# Patient Record
Sex: Female | Born: 1940 | Race: Black or African American | Hispanic: No | Marital: Married | Smoking: Former smoker
Health system: Southern US, Community
[De-identification: ages and names within clinical notes are randomized; demographics above are authoritative.]

## PROBLEM LIST (undated history)

## (undated) DIAGNOSIS — K118 Other diseases of salivary glands: Secondary | ICD-10-CM

## (undated) DIAGNOSIS — R911 Solitary pulmonary nodule: Secondary | ICD-10-CM

## (undated) DIAGNOSIS — E119 Type 2 diabetes mellitus without complications: Secondary | ICD-10-CM

## (undated) DIAGNOSIS — I10 Essential (primary) hypertension: Secondary | ICD-10-CM

## (undated) DIAGNOSIS — E782 Mixed hyperlipidemia: Secondary | ICD-10-CM

## (undated) HISTORY — DX: Solitary pulmonary nodule: R91.1

## (undated) HISTORY — DX: Other diseases of salivary glands: K11.8

## (undated) HISTORY — DX: Essential (primary) hypertension: I10

## (undated) HISTORY — DX: Mixed hyperlipidemia: E78.2

## (undated) HISTORY — PX: ABDOMINAL HYSTERECTOMY: SHX81

## (undated) HISTORY — DX: Type 2 diabetes mellitus without complications: E11.9

---

## 1999-07-09 ENCOUNTER — Encounter: Admission: RE | Admit: 1999-07-09 | Discharge: 1999-07-09 | Payer: Self-pay | Admitting: *Deleted

## 2000-01-15 ENCOUNTER — Other Ambulatory Visit: Admission: RE | Admit: 2000-01-15 | Discharge: 2000-01-15 | Payer: Self-pay | Admitting: *Deleted

## 2001-04-08 ENCOUNTER — Other Ambulatory Visit: Admission: RE | Admit: 2001-04-08 | Discharge: 2001-04-08 | Payer: Self-pay | Admitting: *Deleted

## 2004-07-17 ENCOUNTER — Other Ambulatory Visit: Admission: RE | Admit: 2004-07-17 | Discharge: 2004-07-17 | Payer: Self-pay | Admitting: Internal Medicine

## 2012-02-03 ENCOUNTER — Emergency Department (HOSPITAL_COMMUNITY)
Admission: EM | Admit: 2012-02-03 | Discharge: 2012-02-03 | Disposition: A | Discharge status: Home / Self Care | Payer: Medicare Other | Attending: Emergency Medicine | Admitting: Emergency Medicine

## 2012-02-03 ENCOUNTER — Encounter (HOSPITAL_COMMUNITY): Payer: Self-pay | Admitting: Emergency Medicine

## 2012-02-03 DIAGNOSIS — L299 Pruritus, unspecified: Secondary | ICD-10-CM | POA: Insufficient documentation

## 2012-02-03 DIAGNOSIS — R21 Rash and other nonspecific skin eruption: Secondary | ICD-10-CM

## 2012-02-03 MED ORDER — HYDROCORTISONE 1 % EX CREA: TOPICAL_CREAM | Freq: Three times a day (TID) | CUTANEOUS | Status: AC

## 2012-02-03 MED ORDER — LORATADINE 10 MG PO TABS: 10.0000 mg | ORAL_TABLET | Freq: Every day | ORAL | Status: DC

## 2012-02-03 MED ORDER — HYDROCORTISONE 1 % EX CREA
TOPICAL_CREAM | Freq: Three times a day (TID) | CUTANEOUS | Status: DC
Start: 2012-02-03 — End: 2012-02-03
  Administered 2012-02-03: 04:00:00 via TOPICAL
  Filled 2012-02-03: qty 28

## 2012-02-03 MED ORDER — FAMOTIDINE 20 MG PO TABS
20.0000 mg | ORAL_TABLET | Freq: Once | ORAL | Status: AC
  Administered 2012-02-03: 20 mg via ORAL
  Filled 2012-02-03: qty 1

## 2012-02-03 MED ORDER — HYDROXYZINE HCL 25 MG PO TABS
50.0000 mg | ORAL_TABLET | Freq: Once | ORAL | Status: AC
  Administered 2012-02-03: 50 mg via ORAL
  Filled 2012-02-03 (×2): qty 1

## 2012-02-03 MED ORDER — HYDROXYZINE HCL 25 MG PO TABS: 25.0000 mg | ORAL_TABLET | Freq: Four times a day (QID) | ORAL | Status: AC | PRN

## 2012-02-03 MED ORDER — FAMOTIDINE 20 MG PO TABS: 20.0000 mg | ORAL_TABLET | Freq: Two times a day (BID) | ORAL | Status: DC

## 2012-02-03 NOTE — ED Notes (Signed)
PT. REPORTS PERSISTENT ITCHY RASHES AT UPPER/INNER THIGHS FOR SEVERAL DAYS , SEEN BY HER PCP DIAGNOSED WITH YEAST INFECTION - PRESCRIBED WITH KETOCONAZOLE CREAM WITH NO IMPROVEMENT.

## 2012-02-03 NOTE — ED Notes (Signed)
Persistent itch upper inner thighs. Pt saw MD 2 days ago and was given Benadryl PO and a Benadryl cream. Pt states that MD said it was from patient diabetes.

## 2012-02-08 NOTE — ED Provider Notes (Signed)
History     CSN: [id number]  Arrival date & time 02/03/12  0228   First MD Initiated Contact with Patient 02/03/12 [phone]      Chief Complaint  Patient presents with  . Pruritis    (Consider location/radiation/quality/duration/timing/severity/associated sxs/prior treatment) HPI 71 yo female presents to the ER with complaint of itching.  Pt reports onset of rash to inner thighs initially, now on upper back, arms.  Pt was seen by her pcm, told she had fungal infection and started on ketoconazole.  Pt has been taking benadryl as well without relief.  No new soaps/detergents.  No prior h/o similar rashes. No insect exposure, no poison ivy/oak.    Past Medical History  Diagnosis Date  . Hypertension   . Diabetes mellitus     History reviewed. No pertinent past surgical history.  No family history on file.  History  Substance Use Topics  . Smoking status: Current Everyday Smoker  . Smokeless tobacco: Not on file  . Alcohol Use: No    OB History    Grav Para Term Preterm Abortions TAB SAB Ect Mult Living                  Review of Systems  All other systems reviewed and are negative.    Allergies  Review of patient's allergies indicates no known allergies.  Home Medications   Current Outpatient Rx  Name Route Sig Dispense Refill  . ATORVASTATIN CALCIUM 40 MG PO TABS Oral Take 20 mg by mouth every other day.    Marland Kitchen KETOCONAZOLE 2 % EX CREA Topical Apply 1 application topically 2 (two) times daily. Apply to affected areas    . LISINOPRIL 40 MG PO TABS Oral Take 40 mg by mouth daily.    Marland Kitchen METFORMIN HCL 500 MG PO TABS Oral Take 500 mg by mouth 2 (two) times daily with a meal.    . TRIAMTERENE-HCTZ 37.5-25 MG PO CAPS Oral Take 1 capsule by mouth every morning.    Marland Kitchen FAMOTIDINE 20 MG PO TABS Oral Take 1 tablet (20 mg total) by mouth 2 (two) times daily. 30 tablet 0  . HYDROCORTISONE 1 % EX CREA Topical Apply topically 3 (three) times daily. 30 g 0  . HYDROXYZINE HCL 25 MG  PO TABS Oral Take 1 tablet (25 mg total) by mouth every 6 (six) hours as needed for itching. 12 tablet 0  . LORATADINE 10 MG PO TABS Oral Take 1 tablet (10 mg total) by mouth daily. 15 tablet 0    BP 105/59  Pulse 57  Temp 97.9 F (36.6 C) (Oral)  Resp 18  SpO2 96%  Physical Exam  Nursing note and vitals reviewed. Skin:       Rash noted to thighs, excoriated with macular appearance.  Rash on back slightly raised, linear in almost christmas tree pattern, no cigarette paper appearance or herald patch noted    ED Course  Procedures (including critical care time)  Labs Reviewed - No data to display No results found.   1. Rash and nonspecific skin eruption       MDM  Pt with pruritic rash.  She is feeling better after hydrocortisone cream and hydroxyzine.  Possible pityriasis like rash on back, but does not follow classic presentation.  Will continue symptomatic treatment and f/u with pcm.        Olivia Mackie, MD 02/08/12 2040

## 2013-04-11 ENCOUNTER — Encounter (HOSPITAL_COMMUNITY): Payer: Self-pay | Admitting: Nurse Practitioner

## 2013-04-11 ENCOUNTER — Emergency Department (HOSPITAL_COMMUNITY)
Admission: EM | Admit: 2013-04-11 | Discharge: 2013-04-11 | Discharge status: Against Medical Advice | Payer: Medicare Other | Source: Home / Self Care

## 2013-04-11 ENCOUNTER — Emergency Department (HOSPITAL_BASED_OUTPATIENT_CLINIC_OR_DEPARTMENT_OTHER)
Admission: EM | Admit: 2013-04-11 | Discharge: 2013-04-11 | Disposition: A | Discharge status: Home / Self Care | Payer: Medicare Other | Attending: Emergency Medicine | Admitting: Emergency Medicine

## 2013-04-11 ENCOUNTER — Encounter (HOSPITAL_BASED_OUTPATIENT_CLINIC_OR_DEPARTMENT_OTHER): Payer: Self-pay | Admitting: *Deleted

## 2013-04-11 ENCOUNTER — Emergency Department (HOSPITAL_BASED_OUTPATIENT_CLINIC_OR_DEPARTMENT_OTHER): Payer: Medicare Other

## 2013-04-11 DIAGNOSIS — F17200 Nicotine dependence, unspecified, uncomplicated: Secondary | ICD-10-CM | POA: Insufficient documentation

## 2013-04-11 DIAGNOSIS — I10 Essential (primary) hypertension: Secondary | ICD-10-CM | POA: Insufficient documentation

## 2013-04-11 DIAGNOSIS — M549 Dorsalgia, unspecified: Secondary | ICD-10-CM | POA: Insufficient documentation

## 2013-04-11 DIAGNOSIS — Z79899 Other long term (current) drug therapy: Secondary | ICD-10-CM | POA: Insufficient documentation

## 2013-04-11 DIAGNOSIS — E119 Type 2 diabetes mellitus without complications: Secondary | ICD-10-CM | POA: Insufficient documentation

## 2013-04-11 DIAGNOSIS — Z7982 Long term (current) use of aspirin: Secondary | ICD-10-CM | POA: Insufficient documentation

## 2013-04-11 DIAGNOSIS — R112 Nausea with vomiting, unspecified: Secondary | ICD-10-CM

## 2013-04-11 DIAGNOSIS — R1110 Vomiting, unspecified: Secondary | ICD-10-CM | POA: Insufficient documentation

## 2013-04-11 DIAGNOSIS — Z90710 Acquired absence of both cervix and uterus: Secondary | ICD-10-CM | POA: Insufficient documentation

## 2013-04-11 LAB — CBC WITH DIFFERENTIAL/PLATELET
Eosinophils Relative: 0 % (ref 0–5)
Lymphocytes Relative: 24 % (ref 12–46)
Lymphs Abs: 1.4 10*3/uL (ref 0.7–4.0)
MCV: 76.3 fL — ABNORMAL LOW (ref 78.0–100.0)
Neutro Abs: 3.8 10*3/uL (ref 1.7–7.7)
Platelets: 196 10*3/uL (ref 150–400)
RBC: 6.58 MIL/uL — ABNORMAL HIGH (ref 3.87–5.11)
WBC: 5.6 10*3/uL (ref 4.0–10.5)

## 2013-04-11 LAB — URINALYSIS, ROUTINE W REFLEX MICROSCOPIC
Bilirubin Urine: NEGATIVE
Ketones, ur: 15 mg/dL — AB
Protein, ur: NEGATIVE mg/dL
Urobilinogen, UA: 0.2 mg/dL (ref 0.0–1.0)
pH: 7 (ref 5.0–8.0)

## 2013-04-11 LAB — COMPREHENSIVE METABOLIC PANEL
ALT: 11 U/L (ref 0–35)
AST: 15 U/L (ref 0–37)
Alkaline Phosphatase: 84 U/L (ref 39–117)
CO2: 26 mEq/L (ref 19–32)
Calcium: 10.4 mg/dL (ref 8.4–10.5)
Chloride: 97 mEq/L (ref 96–112)
GFR calc Af Amer: 63 mL/min — ABNORMAL LOW (ref >90)
GFR calc non Af Amer: 55 mL/min — ABNORMAL LOW (ref >90)
Glucose, Bld: 135 mg/dL — ABNORMAL HIGH (ref 70–99)
Potassium: 4.2 mEq/L (ref 3.5–5.1)
Sodium: 136 mEq/L (ref 135–145)
Total Bilirubin: 0.7 mg/dL (ref 0.3–1.2)

## 2013-04-11 MED ORDER — IOHEXOL 300 MG/ML  SOLN
100.0000 mL | Freq: Once | INTRAMUSCULAR | Status: AC | PRN
  Administered 2013-04-11: 100 mL via INTRAVENOUS

## 2013-04-11 MED ORDER — ONDANSETRON 4 MG PO TBDP
8.0000 mg | ORAL_TABLET | Freq: Once | ORAL | Status: AC
  Administered 2013-04-11: 8 mg via ORAL
  Filled 2013-04-11: qty 2

## 2013-04-11 MED ORDER — IOHEXOL 300 MG/ML  SOLN
50.0000 mL | Freq: Once | INTRAMUSCULAR | Status: AC | PRN
  Administered 2013-04-11: 50 mL via ORAL

## 2013-04-11 MED ORDER — ONDANSETRON HCL 4 MG PO TABS: 4.0000 mg | ORAL_TABLET | Freq: Three times a day (TID) | ORAL | Status: DC | PRN

## 2013-04-11 NOTE — ED Notes (Signed)
States since she ate chicken on Sunday she has been nauseated with abd and back pain. Reports normal BM this am.

## 2013-04-11 NOTE — ED Notes (Signed)
Pt. Actively vomiting in the waiting room.  Pt. Medicated per protocol with Zofran 8mg  ODT cool wash cloth given.

## 2013-04-11 NOTE — ED Notes (Signed)
Pt c/o abd pain with nausea only x 3 days

## 2013-04-11 NOTE — ED Notes (Signed)
Encouraged pt. To stay.  Pt. s son informed RN that they were taking pt. To [facility].

## 2013-04-11 NOTE — ED Provider Notes (Signed)
CSN: [id number]     Arrival date & time 04/11/13  1842 History  This chart was scribed for Melanie B. Bernette Mayers, MD by Dorothey Baseman, ED Scribe. This patient was seen in room MH04/MH04 and the patient's care was started at 8:34 PM.    Chief Complaint  Patient presents with  . Abdominal Pain   The history is provided by the patient. No language interpreter was used.   HPI Comments: Melanie Weaver is a 72 y.o. female who presents to the Emergency Department complaining of abdominal pain, described as a cramping that is exacerbated with movement, with associated nausea and back pain onset 3 days ago with associated emesis onset last night (last episode a few hours ago). She reports taking milk of magnesia 3 days ago without relief. Patient reports a normal BM this morning. She denies any abdominal bloating and hematemesis. She denies any sick contacts. Patient reports a history of hysterectomy.   Past Medical History  Diagnosis Date  . Hypertension   . Diabetes mellitus    Past Surgical History  Procedure Laterality Date  . Abdominal hysterectomy     History reviewed. No pertinent family history. History  Substance Use Topics  . Smoking status: Current Every Day Smoker    Types: Cigarettes  . Smokeless tobacco: Not on file  . Alcohol Use: No   OB History   Grav Para Term Preterm Abortions TAB SAB Ect Mult Living                 Review of Systems  A complete 10 system review of systems was obtained and all systems are negative except as noted in the HPI and PMH.   Allergies  Review of patient's allergies indicates no known allergies.  Home Medications   Current Outpatient Rx  Name  Route  Sig  Dispense  Refill  . aspirin EC 81 MG tablet   Oral   Take 81 mg by mouth daily.         Marland Kitchen lisinopril (PRINIVIL,ZESTRIL) 40 MG tablet   Oral   Take 40 mg by mouth daily.         . metFORMIN (GLUCOPHAGE) 500 MG tablet   Oral   Take 500 mg by mouth 2 (two) times daily with a  meal.         . Multiple Vitamin (MULTIVITAMIN WITH MINERALS) TABS tablet   Oral   Take 1 tablet by mouth daily.         Marland Kitchen triamterene-hydrochlorothiazide (MAXZIDE-25) 37.5-25 MG per tablet   Oral   Take 1 tablet by mouth daily.          Triage Vitals: BP 157/58  Pulse 65  Temp(Src) 99.4 F (37.4 C) (Oral)  Resp 16  Ht 5\' 2"  (1.575 m)  Wt 165 lb (74.844 kg)  BMI 30.17 kg/m2  SpO2 98%  Physical Exam  Constitutional: She is oriented to person, place, and time. She appears well-developed and well-nourished.  HENT:  Head: Normocephalic and atraumatic.  Mouth/Throat: Oropharynx is clear and moist.  Mouth appears a little dry.  Neck: Neck supple.  Pulmonary/Chest: Effort normal.  Abdominal: Soft. Bowel sounds are normal. She exhibits no distension. There is no tenderness. There is no rebound and no guarding.  Neurological: She is alert and oriented to person, place, and time. No cranial nerve deficit.  Psychiatric: She has a normal mood and affect. Her behavior is normal.    ED Course  Procedures (including critical care time)  Medications  iohexol (OMNIPAQUE) 300 MG/ML solution 50 mL (50 mLs Oral Contrast Given 04/11/13 2053)  iohexol (OMNIPAQUE) 300 MG/ML solution 100 mL (100 mLs Intravenous Contrast Given 04/11/13 2154)    DIAGNOSTIC STUDIES: Oxygen Saturation is 98% on room air, normal by my interpretation.    COORDINATION OF CARE: 8:36PM- Will order UA and an abdominal CT scan. Discussed treatment plan with patient at bedside and patient verbalized agreement.     Labs Review Labs Reviewed  URINALYSIS, ROUTINE W REFLEX MICROSCOPIC - Abnormal; Notable for the following:    APPearance TURBID (*)    Ketones, ur 15 (*)    Leukocytes, UA SMALL (*)    All other components within normal limits  CBC WITH DIFFERENTIAL - Abnormal; Notable for the following:    RBC 6.58 (*)    Hemoglobin 17.0 (*)    HCT 50.2 (*)    MCV 76.3 (*)    MCH 25.8 (*)    RDW 16.5 (*)     All other components within normal limits  COMPREHENSIVE METABOLIC PANEL - Abnormal; Notable for the following:    Glucose, Bld 135 (*)    GFR calc non Af Amer 55 (*)    GFR calc Af Amer 63 (*)    All other components within normal limits  URINE MICROSCOPIC-ADD ON - Abnormal; Notable for the following:    Squamous Epithelial / LPF FEW (*)    Bacteria, UA FEW (*)    All other components within normal limits  URINE CULTURE  LIPASE, BLOOD  CG4 I-STAT (LACTIC ACID)   Imaging Review Ct Abdomen Pelvis W Contrast  04/11/2013   CLINICAL DATA:  Left abdominal pain  EXAM: CT ABDOMEN AND PELVIS WITH CONTRAST  TECHNIQUE: Multidetector CT imaging of the abdomen and pelvis was performed using the standard protocol following bolus administration of intravenous contrast.  CONTRAST:  50mL OMNIPAQUE IOHEXOL 300 MG/ML SOLN, OMNIPAQUE IOHEXOL 300 MG/ML SOLN  COMPARISON:  None.  FINDINGS: Lower Chest: The lung bases are clear. Visualized cardiac structures are within normal limits for size. No pericardial effusion. Unremarkable visualized distal thoracic esophagus.  Abdomen: Unremarkable CT appearance of the stomach, duodenum, spleen, adrenal glands,. Mild prominence of the main pancreatic duct to 4 mm. A tiny 4 mm hypoattenuating focus in the posterior aspect of the pancreatic body is nonspecific.  Normal hepatic morphology and contours. Geographic hypoattenuation in the left hemi-liver adjacent to the fissure for the falciform ligament is nonspecific but most suggestive of benign focal fatty infiltration. Nonspecific subcentimeter hypo attenuating focus in hepatic segment 2 is too small for accurate characterization. Statistically, this is likely a benign cyst. High attenuation stones layer within the gallbladder lumen. No gallbladder wall thickening or pericholecystic fluid.  No hydronephrosis or enhancing renal mass. Punctate 2- 3 mm nonobstructing stones bilaterally.  No evidence of obstruction or focal bowel  wall thickening. The appendix is not identified. However, there is no focal inflammatory change in the right lower quadrant to suggest appendicitis. The terminal ileum is unremarkable.  Pelvis: Unremarkable bladder. Surgical changes of prior hysterectomy. Bilateral adnexa are unremarkable. No free fluid or suspicious adenopathy.  Bones/Soft Tissues: No acute fracture or aggressive appearing lytic or blastic osseous lesion. Multilevel degenerative disc disease most significant at L4-L5. Lower lumbar facet arthropathy. Nonspecific well-marginated sclerotic focus in the posterior right acetabulum likely represents a bone island.  Vascular: Extensive atherosclerotic vascular calcifications without aneurysmal dilatation.  IMPRESSION: 1. No acute abnormality in the abdomen or pelvis. 2. Cholelithiasis without  secondary signs to suggest acute cholecystitis. 3. Nonspecific small 4 mm hypo attenuating focus in the posterior aspect of the pancreatic body. Differential considerations include small pancreatic cyst, branch duct IPMN and early pancreatic neoplasm. Consider further evaluation with pancreas protocol MRI in 1 year 's time. 4. Extensive atherosclerotic vascular calcifications without aneurysmal dilatation. 5. Nonobstructing renal calculi bilaterally.   Electronically Signed   By: Malachy Moan M.D.   On: 04/11/2013 22:21    MDM   1. Nausea & vomiting     Labs and imaging reviewed. Pt and family informed of incidental CT findings. Pt feeling better, ready to go home. No vomiting in the ED.    I personally performed the services described in this documentation, which was scribed in my presence. The recorded information has been reviewed and is accurate.       Melanie B. Bernette Mayers, MD 04/11/13 2255

## 2013-04-13 LAB — URINE CULTURE
Colony Count: NO GROWTH
Culture: NO GROWTH

## 2013-05-04 ENCOUNTER — Encounter (HOSPITAL_COMMUNITY): Payer: Self-pay | Admitting: Emergency Medicine

## 2013-05-04 ENCOUNTER — Emergency Department (HOSPITAL_COMMUNITY)
Admission: EM | Admit: 2013-05-04 | Discharge: 2013-05-04 | Disposition: A | Discharge status: Home / Self Care | Payer: Medicare Other | Attending: Emergency Medicine | Admitting: Emergency Medicine

## 2013-05-04 ENCOUNTER — Emergency Department (HOSPITAL_COMMUNITY): Payer: Medicare Other

## 2013-05-04 DIAGNOSIS — R1084 Generalized abdominal pain: Secondary | ICD-10-CM | POA: Insufficient documentation

## 2013-05-04 DIAGNOSIS — Z7982 Long term (current) use of aspirin: Secondary | ICD-10-CM | POA: Insufficient documentation

## 2013-05-04 DIAGNOSIS — K5900 Constipation, unspecified: Secondary | ICD-10-CM

## 2013-05-04 DIAGNOSIS — I10 Essential (primary) hypertension: Secondary | ICD-10-CM | POA: Insufficient documentation

## 2013-05-04 DIAGNOSIS — F17200 Nicotine dependence, unspecified, uncomplicated: Secondary | ICD-10-CM | POA: Insufficient documentation

## 2013-05-04 DIAGNOSIS — R109 Unspecified abdominal pain: Secondary | ICD-10-CM

## 2013-05-04 DIAGNOSIS — E119 Type 2 diabetes mellitus without complications: Secondary | ICD-10-CM | POA: Insufficient documentation

## 2013-05-04 DIAGNOSIS — Z79899 Other long term (current) drug therapy: Secondary | ICD-10-CM | POA: Insufficient documentation

## 2013-05-04 DIAGNOSIS — R1110 Vomiting, unspecified: Secondary | ICD-10-CM | POA: Insufficient documentation

## 2013-05-04 LAB — COMPREHENSIVE METABOLIC PANEL
ALT: 10 U/L (ref 0–35)
Albumin: 4 g/dL (ref 3.5–5.2)
Alkaline Phosphatase: 77 U/L (ref 39–117)
BUN: 22 mg/dL (ref 6–23)
Calcium: 10.4 mg/dL (ref 8.4–10.5)
GFR calc Af Amer: 72 mL/min — ABNORMAL LOW (ref >90)
Glucose, Bld: 110 mg/dL — ABNORMAL HIGH (ref 70–99)
Potassium: 3.9 mEq/L (ref 3.5–5.1)
Sodium: 136 mEq/L (ref 135–145)
Total Protein: 7.3 g/dL (ref 6.0–8.3)

## 2013-05-04 LAB — URINALYSIS, ROUTINE W REFLEX MICROSCOPIC
Bilirubin Urine: NEGATIVE
Leukocytes, UA: NEGATIVE
Nitrite: NEGATIVE
Specific Gravity, Urine: 1.021 (ref 1.005–1.030)
Urobilinogen, UA: 1 mg/dL (ref 0.0–1.0)
pH: 7 (ref 5.0–8.0)

## 2013-05-04 LAB — CBC WITH DIFFERENTIAL/PLATELET
Basophils Relative: 1 % (ref 0–1)
Eosinophils Absolute: 0 10*3/uL (ref 0.0–0.7)
Eosinophils Relative: 1 % (ref 0–5)
Lymphs Abs: 1.5 10*3/uL (ref 0.7–4.0)
MCH: 26.4 pg (ref 26.0–34.0)
MCHC: 34.3 g/dL (ref 30.0–36.0)
MCV: 76.9 fL — ABNORMAL LOW (ref 78.0–100.0)
Neutrophils Relative %: 62 % (ref 43–77)
Platelets: 186 10*3/uL (ref 150–400)

## 2013-05-04 LAB — LIPASE, BLOOD: Lipase: 35 U/L (ref 11–59)

## 2013-05-04 MED ORDER — ONDANSETRON HCL 4 MG/2ML IJ SOLN
4.0000 mg | Freq: Once | INTRAMUSCULAR | Status: AC
  Administered 2013-05-04: 4 mg via INTRAVENOUS
  Filled 2013-05-04: qty 2

## 2013-05-04 MED ORDER — SODIUM CHLORIDE 0.9 % IV SOLN
INTRAVENOUS | Status: DC
  Administered 2013-05-04: 13:00:00 via INTRAVENOUS

## 2013-05-04 MED ORDER — SUCRALFATE 1 G PO TABS: 1.0000 g | ORAL_TABLET | Freq: Four times a day (QID) | ORAL | Status: DC

## 2013-05-04 NOTE — ED Provider Notes (Signed)
CSN: [id number]     Arrival date & time 05/04/13  1135 History   First MD Initiated Contact with Patient 05/04/13 1157     Chief Complaint  Patient presents with  . Abdominal Pain   (Consider location/radiation/quality/duration/timing/severity/associated sxs/prior Treatment) Patient is a 72 y.o. female presenting with abdominal pain. The history is provided by the patient.  Abdominal Pain  patient complain of having abdominal pain that began this morning after she drank liquids. Has had emesis x2 which was nonbilious without fever or diarrhea. Seen at [facility] recently for similar symptoms and was diagnosed with possible kidney stone. She denies any hematuria or dysuria now. No chest pain or chest pressure. No dyspnea noted. No syncope or near syncope. Pain is characterized as colicky and no treatment used for this prior to arrival  Past Medical History  Diagnosis Date  . Hypertension   . Diabetes mellitus    Past Surgical History  Procedure Laterality Date  . Abdominal hysterectomy     No family history on file. History  Substance Use Topics  . Smoking status: Current Every Day Smoker    Types: Cigarettes  . Smokeless tobacco: Not on file  . Alcohol Use: No   OB History   Grav Para Term Preterm Abortions TAB SAB Ect Mult Living                 Review of Systems  Gastrointestinal: Positive for abdominal pain.  All other systems reviewed and are negative.    Allergies  Review of patient's allergies indicates no known allergies.  Home Medications   Current Outpatient Rx  Name  Route  Sig  Dispense  Refill  . aspirin EC 81 MG tablet   Oral   Take 81 mg by mouth daily.         Marland Kitchen lisinopril (PRINIVIL,ZESTRIL) 40 MG tablet   Oral   Take 40 mg by mouth daily.         . metFORMIN (GLUCOPHAGE) 500 MG tablet   Oral   Take 500 mg by mouth 2 (two) times daily with a meal.         . Multiple Vitamin (MULTIVITAMIN WITH MINERALS) TABS tablet   Oral  Take 1 tablet by mouth daily.         Marland Kitchen triamterene-hydrochlorothiazide (MAXZIDE-25) 37.5-25 MG per tablet   Oral   Take 1 tablet by mouth daily.          BP 155/69  Pulse 63  Temp(Src) 99 F (37.2 C) (Oral)  Resp 20  SpO2 98% Physical Exam  Nursing note and vitals reviewed. Constitutional: She is oriented to person, place, and time. She appears well-developed and well-nourished.  Non-toxic appearance. No distress.  HENT:  Head: Normocephalic and atraumatic.  Eyes: Conjunctivae, EOM and lids are normal. Pupils are equal, round, and reactive to light.  Neck: Normal range of motion. Neck supple. No tracheal deviation present. No mass present.  Cardiovascular: Normal rate, regular rhythm and normal heart sounds.  Exam reveals no gallop.   No murmur heard. Pulmonary/Chest: Effort normal and breath sounds normal. No stridor. No respiratory distress. She has no decreased breath sounds. She has no wheezes. She has no rhonchi. She has no rales.  Abdominal: Soft. Normal appearance and bowel sounds are normal. She exhibits no distension. There is generalized tenderness. There is no rigidity, no rebound and no CVA tenderness.  Musculoskeletal: Normal range of motion. She exhibits no edema and no tenderness.  Neurological: She is alert and oriented to person, place, and time. She has normal strength. No cranial nerve deficit or sensory deficit. GCS eye subscore is 4. GCS verbal subscore is 5. GCS motor subscore is 6.  Skin: Skin is warm and dry. No abrasion and no rash noted.  Psychiatric: She has a normal mood and affect. Her speech is normal and behavior is normal.    ED Course  Procedures (including critical care time) Labs Review Labs Reviewed  CBC WITH DIFFERENTIAL  COMPREHENSIVE METABOLIC PANEL  LIPASE, BLOOD  URINALYSIS, ROUTINE W REFLEX MICROSCOPIC   Imaging Review No results found.  EKG Interpretation   None       MDM  No diagnosis found.   Pt given iv fluids and  feels better, no surgical abd--suspect constipation--stable for d/c  Toy Baker, MD 05/04/13 1531

## 2013-05-04 NOTE — ED Notes (Signed)
Pt aware we need urine sample, sts she didn't have anything to drink all day and will need a little bit more time.

## 2013-05-04 NOTE — ED Notes (Signed)
Pt states the other week [facility] for abdominal [facility] and nausea, was told had kidney stone, pt states went away for about a week then last night started having abdominal pains again, states feels like she's having contractions.

## 2015-08-30 DIAGNOSIS — E782 Mixed hyperlipidemia: Secondary | ICD-10-CM | POA: Diagnosis not present

## 2015-08-30 DIAGNOSIS — I10 Essential (primary) hypertension: Secondary | ICD-10-CM | POA: Diagnosis not present

## 2015-08-30 DIAGNOSIS — E119 Type 2 diabetes mellitus without complications: Secondary | ICD-10-CM | POA: Diagnosis not present

## 2015-08-30 DIAGNOSIS — Z0000 Encounter for general adult medical examination without abnormal findings: Secondary | ICD-10-CM | POA: Diagnosis not present

## 2015-09-05 DIAGNOSIS — I10 Essential (primary) hypertension: Secondary | ICD-10-CM | POA: Diagnosis not present

## 2015-09-05 DIAGNOSIS — E782 Mixed hyperlipidemia: Secondary | ICD-10-CM | POA: Diagnosis not present

## 2015-09-05 DIAGNOSIS — E119 Type 2 diabetes mellitus without complications: Secondary | ICD-10-CM | POA: Diagnosis not present

## 2015-11-14 DIAGNOSIS — E119 Type 2 diabetes mellitus without complications: Secondary | ICD-10-CM | POA: Diagnosis not present

## 2015-11-14 DIAGNOSIS — H2513 Age-related nuclear cataract, bilateral: Secondary | ICD-10-CM | POA: Diagnosis not present

## 2016-05-07 DIAGNOSIS — E782 Mixed hyperlipidemia: Secondary | ICD-10-CM | POA: Diagnosis not present

## 2016-05-07 DIAGNOSIS — E119 Type 2 diabetes mellitus without complications: Secondary | ICD-10-CM | POA: Diagnosis not present

## 2016-05-07 DIAGNOSIS — I10 Essential (primary) hypertension: Secondary | ICD-10-CM | POA: Diagnosis not present

## 2016-05-14 DIAGNOSIS — E782 Mixed hyperlipidemia: Secondary | ICD-10-CM | POA: Diagnosis not present

## 2016-05-14 DIAGNOSIS — E119 Type 2 diabetes mellitus without complications: Secondary | ICD-10-CM | POA: Diagnosis not present

## 2016-05-14 DIAGNOSIS — I10 Essential (primary) hypertension: Secondary | ICD-10-CM | POA: Diagnosis not present

## 2016-07-15 DIAGNOSIS — I10 Essential (primary) hypertension: Secondary | ICD-10-CM | POA: Diagnosis not present

## 2016-07-15 DIAGNOSIS — E782 Mixed hyperlipidemia: Secondary | ICD-10-CM | POA: Diagnosis not present

## 2016-07-15 DIAGNOSIS — E119 Type 2 diabetes mellitus without complications: Secondary | ICD-10-CM | POA: Diagnosis not present

## 2016-10-07 DIAGNOSIS — E119 Type 2 diabetes mellitus without complications: Secondary | ICD-10-CM | POA: Diagnosis not present

## 2016-10-07 DIAGNOSIS — Z780 Asymptomatic menopausal state: Secondary | ICD-10-CM | POA: Diagnosis not present

## 2016-10-07 DIAGNOSIS — E782 Mixed hyperlipidemia: Secondary | ICD-10-CM | POA: Diagnosis not present

## 2016-10-07 DIAGNOSIS — Z0000 Encounter for general adult medical examination without abnormal findings: Secondary | ICD-10-CM | POA: Diagnosis not present

## 2016-10-14 DIAGNOSIS — I10 Essential (primary) hypertension: Secondary | ICD-10-CM | POA: Diagnosis not present

## 2016-10-14 DIAGNOSIS — E782 Mixed hyperlipidemia: Secondary | ICD-10-CM | POA: Diagnosis not present

## 2016-10-14 DIAGNOSIS — Z780 Asymptomatic menopausal state: Secondary | ICD-10-CM | POA: Diagnosis not present

## 2016-10-14 DIAGNOSIS — E119 Type 2 diabetes mellitus without complications: Secondary | ICD-10-CM | POA: Diagnosis not present

## 2016-11-21 DIAGNOSIS — R252 Cramp and spasm: Secondary | ICD-10-CM | POA: Diagnosis not present

## 2016-11-21 DIAGNOSIS — R899 Unspecified abnormal finding in specimens from other organs, systems and tissues: Secondary | ICD-10-CM | POA: Diagnosis not present

## 2016-11-21 DIAGNOSIS — R918 Other nonspecific abnormal finding of lung field: Secondary | ICD-10-CM | POA: Diagnosis not present

## 2016-11-21 DIAGNOSIS — R05 Cough: Secondary | ICD-10-CM | POA: Diagnosis not present

## 2016-11-21 DIAGNOSIS — J181 Lobar pneumonia, unspecified organism: Secondary | ICD-10-CM | POA: Diagnosis not present

## 2016-11-21 DIAGNOSIS — R509 Fever, unspecified: Secondary | ICD-10-CM | POA: Diagnosis not present

## 2016-11-21 DIAGNOSIS — I771 Stricture of artery: Secondary | ICD-10-CM | POA: Diagnosis not present

## 2016-11-21 DIAGNOSIS — R5383 Other fatigue: Secondary | ICD-10-CM | POA: Diagnosis not present

## 2016-11-22 DIAGNOSIS — R252 Cramp and spasm: Secondary | ICD-10-CM | POA: Diagnosis not present

## 2016-11-22 DIAGNOSIS — R5383 Other fatigue: Secondary | ICD-10-CM | POA: Diagnosis not present

## 2016-11-28 DIAGNOSIS — J189 Pneumonia, unspecified organism: Secondary | ICD-10-CM | POA: Diagnosis not present

## 2016-11-28 DIAGNOSIS — R899 Unspecified abnormal finding in specimens from other organs, systems and tissues: Secondary | ICD-10-CM | POA: Diagnosis not present

## 2016-11-28 DIAGNOSIS — Z7689 Persons encountering health services in other specified circumstances: Secondary | ICD-10-CM | POA: Diagnosis not present

## 2016-11-28 DIAGNOSIS — M47899 Other spondylosis, site unspecified: Secondary | ICD-10-CM | POA: Diagnosis not present

## 2016-11-28 DIAGNOSIS — J181 Lobar pneumonia, unspecified organism: Secondary | ICD-10-CM | POA: Diagnosis not present

## 2017-01-05 DIAGNOSIS — E785 Hyperlipidemia, unspecified: Secondary | ICD-10-CM | POA: Insufficient documentation

## 2017-01-05 DIAGNOSIS — I10 Essential (primary) hypertension: Secondary | ICD-10-CM | POA: Diagnosis not present

## 2017-01-05 DIAGNOSIS — E119 Type 2 diabetes mellitus without complications: Secondary | ICD-10-CM | POA: Diagnosis not present

## 2017-04-06 DIAGNOSIS — E119 Type 2 diabetes mellitus without complications: Secondary | ICD-10-CM | POA: Diagnosis not present

## 2017-04-06 DIAGNOSIS — Z720 Tobacco use: Secondary | ICD-10-CM | POA: Diagnosis not present

## 2017-04-06 DIAGNOSIS — I10 Essential (primary) hypertension: Secondary | ICD-10-CM | POA: Diagnosis not present

## 2017-04-06 DIAGNOSIS — E785 Hyperlipidemia, unspecified: Secondary | ICD-10-CM | POA: Diagnosis not present

## 2017-04-06 DIAGNOSIS — Z23 Encounter for immunization: Secondary | ICD-10-CM | POA: Diagnosis not present

## 2017-11-05 DIAGNOSIS — E119 Type 2 diabetes mellitus without complications: Secondary | ICD-10-CM | POA: Diagnosis not present

## 2017-11-05 DIAGNOSIS — H524 Presbyopia: Secondary | ICD-10-CM | POA: Diagnosis not present

## 2017-11-05 DIAGNOSIS — H5203 Hypermetropia, bilateral: Secondary | ICD-10-CM | POA: Diagnosis not present

## 2017-11-05 DIAGNOSIS — H52203 Unspecified astigmatism, bilateral: Secondary | ICD-10-CM | POA: Diagnosis not present

## 2017-11-05 DIAGNOSIS — Z7984 Long term (current) use of oral hypoglycemic drugs: Secondary | ICD-10-CM | POA: Diagnosis not present

## 2017-11-05 DIAGNOSIS — H2513 Age-related nuclear cataract, bilateral: Secondary | ICD-10-CM | POA: Diagnosis not present

## 2017-11-22 DIAGNOSIS — E785 Hyperlipidemia, unspecified: Secondary | ICD-10-CM | POA: Diagnosis not present

## 2017-11-22 DIAGNOSIS — I10 Essential (primary) hypertension: Secondary | ICD-10-CM | POA: Diagnosis not present

## 2017-11-22 DIAGNOSIS — R7989 Other specified abnormal findings of blood chemistry: Secondary | ICD-10-CM | POA: Diagnosis not present

## 2017-11-22 DIAGNOSIS — E119 Type 2 diabetes mellitus without complications: Secondary | ICD-10-CM | POA: Diagnosis not present

## 2017-11-22 DIAGNOSIS — Z720 Tobacco use: Secondary | ICD-10-CM | POA: Diagnosis not present

## 2017-12-03 DIAGNOSIS — I10 Essential (primary) hypertension: Secondary | ICD-10-CM | POA: Diagnosis not present

## 2017-12-03 DIAGNOSIS — J208 Acute bronchitis due to other specified organisms: Secondary | ICD-10-CM | POA: Diagnosis not present

## 2017-12-03 DIAGNOSIS — J189 Pneumonia, unspecified organism: Secondary | ICD-10-CM | POA: Diagnosis not present

## 2017-12-03 DIAGNOSIS — R918 Other nonspecific abnormal finding of lung field: Secondary | ICD-10-CM | POA: Diagnosis not present

## 2018-01-05 DIAGNOSIS — R5383 Other fatigue: Secondary | ICD-10-CM | POA: Insufficient documentation

## 2018-03-01 DIAGNOSIS — R0609 Other forms of dyspnea: Secondary | ICD-10-CM | POA: Insufficient documentation

## 2018-03-01 DIAGNOSIS — R0600 Dyspnea, unspecified: Secondary | ICD-10-CM | POA: Insufficient documentation

## 2018-05-05 ENCOUNTER — Institutional Professional Consult (permissible substitution): Payer: Self-pay | Admitting: Pulmonary Disease

## 2018-05-05 ENCOUNTER — Observation Stay (HOSPITAL_COMMUNITY)
Admission: EM | Admit: 2018-05-05 | Discharge: 2018-05-06 | Disposition: A | Discharge status: Home / Self Care | Payer: Medicare Other | Attending: Internal Medicine | Admitting: Internal Medicine

## 2018-05-05 ENCOUNTER — Emergency Department (HOSPITAL_COMMUNITY): Payer: Medicare Other

## 2018-05-05 ENCOUNTER — Other Ambulatory Visit: Payer: Self-pay

## 2018-05-05 ENCOUNTER — Encounter (HOSPITAL_COMMUNITY): Payer: Self-pay | Admitting: Emergency Medicine

## 2018-05-05 DIAGNOSIS — I10 Essential (primary) hypertension: Secondary | ICD-10-CM | POA: Diagnosis present

## 2018-05-05 DIAGNOSIS — Z7984 Long term (current) use of oral hypoglycemic drugs: Secondary | ICD-10-CM | POA: Diagnosis not present

## 2018-05-05 DIAGNOSIS — I951 Orthostatic hypotension: Principal | ICD-10-CM | POA: Insufficient documentation

## 2018-05-05 DIAGNOSIS — R404 Transient alteration of awareness: Secondary | ICD-10-CM

## 2018-05-05 DIAGNOSIS — Z79899 Other long term (current) drug therapy: Secondary | ICD-10-CM | POA: Insufficient documentation

## 2018-05-05 DIAGNOSIS — R799 Abnormal finding of blood chemistry, unspecified: Secondary | ICD-10-CM

## 2018-05-05 DIAGNOSIS — F17210 Nicotine dependence, cigarettes, uncomplicated: Secondary | ICD-10-CM | POA: Diagnosis not present

## 2018-05-05 DIAGNOSIS — Z7982 Long term (current) use of aspirin: Secondary | ICD-10-CM | POA: Insufficient documentation

## 2018-05-05 DIAGNOSIS — R42 Dizziness and giddiness: Secondary | ICD-10-CM | POA: Diagnosis not present

## 2018-05-05 DIAGNOSIS — R55 Syncope and collapse: Secondary | ICD-10-CM | POA: Diagnosis present

## 2018-05-05 DIAGNOSIS — K118 Other diseases of salivary glands: Secondary | ICD-10-CM

## 2018-05-05 DIAGNOSIS — D582 Other hemoglobinopathies: Secondary | ICD-10-CM

## 2018-05-05 DIAGNOSIS — E119 Type 2 diabetes mellitus without complications: Secondary | ICD-10-CM

## 2018-05-05 DIAGNOSIS — J449 Chronic obstructive pulmonary disease, unspecified: Secondary | ICD-10-CM

## 2018-05-05 DIAGNOSIS — J432 Centrilobular emphysema: Secondary | ICD-10-CM | POA: Diagnosis present

## 2018-05-05 LAB — BASIC METABOLIC PANEL
Anion gap: 9 (ref 5–15)
BUN: 27 mg/dL — AB (ref 8–23)
CALCIUM: 10.2 mg/dL (ref 8.9–10.3)
CO2: 23 mmol/L (ref 22–32)
Chloride: 106 mmol/L (ref 98–111)
Creatinine, Ser: 0.98 mg/dL (ref 0.44–1.00)
GFR calc Af Amer: >60 mL/min (ref >60)
GFR calc non Af Amer: 54 mL/min — ABNORMAL LOW (ref >60)
GLUCOSE: 134 mg/dL — AB (ref 70–99)
Potassium: 3.9 mmol/L (ref 3.5–5.1)
Sodium: 138 mmol/L (ref 135–145)

## 2018-05-05 LAB — CBC
HCT: 55.1 % — ABNORMAL HIGH (ref 36.0–46.0)
HEMOGLOBIN: 17.7 g/dL — AB (ref 12.0–15.0)
MCH: 25.6 pg — ABNORMAL LOW (ref 26.0–34.0)
MCHC: 32.1 g/dL (ref 30.0–36.0)
MCV: 79.7 fL — AB (ref 80.0–100.0)
PLATELETS: 147 10*3/uL — AB (ref 150–400)
RBC: 6.91 MIL/uL — AB (ref 3.87–5.11)
RDW: 17 % — ABNORMAL HIGH (ref 11.5–15.5)
WBC: 5 10*3/uL (ref 4.0–10.5)
nRBC: 0 % (ref 0.0–0.2)

## 2018-05-05 LAB — URINALYSIS, ROUTINE W REFLEX MICROSCOPIC
Bilirubin Urine: NEGATIVE
GLUCOSE, UA: NEGATIVE mg/dL
Hgb urine dipstick: NEGATIVE
Ketones, ur: NEGATIVE mg/dL
Leukocytes, UA: NEGATIVE
Nitrite: NEGATIVE
Protein, ur: NEGATIVE mg/dL
SPECIFIC GRAVITY, URINE: 1.017 (ref 1.005–1.030)
pH: 6 (ref 5.0–8.0)

## 2018-05-05 LAB — I-STAT TROPONIN, ED: Troponin i, poc: 0.01 ng/mL (ref 0.00–0.08)

## 2018-05-05 LAB — CBG MONITORING, ED: Glucose-Capillary: 141 mg/dL — ABNORMAL HIGH (ref 70–99)

## 2018-05-05 MED ORDER — UMECLIDINIUM-VILANTEROL 62.5-25 MCG/INH IN AEPB
1.0000 | INHALATION_SPRAY | Freq: Every day | RESPIRATORY_TRACT | Status: DC
  Administered 2018-05-06: 1 via RESPIRATORY_TRACT
  Filled 2018-05-05: qty 14

## 2018-05-05 MED ORDER — SODIUM CHLORIDE 0.9% FLUSH
3.0000 mL | Freq: Two times a day (BID) | INTRAVENOUS | Status: DC
  Administered 2018-05-06 (×2): 3 mL via INTRAVENOUS

## 2018-05-05 MED ORDER — TRIAMTERENE-HCTZ 37.5-25 MG PO TABS
1.0000 | ORAL_TABLET | Freq: Every day | ORAL | Status: DC
  Administered 2018-05-06: 1 via ORAL
  Filled 2018-05-05: qty 1

## 2018-05-05 MED ORDER — ENOXAPARIN SODIUM 40 MG/0.4ML ~~LOC~~ SOLN
40.0000 mg | SUBCUTANEOUS | Status: DC
  Administered 2018-05-06: 40 mg via SUBCUTANEOUS
  Filled 2018-05-05: qty 0.4

## 2018-05-05 MED ORDER — ASPIRIN EC 81 MG PO TBEC
81.0000 mg | DELAYED_RELEASE_TABLET | Freq: Every day | ORAL | Status: DC
  Administered 2018-05-06: 81 mg via ORAL
  Filled 2018-05-05: qty 1

## 2018-05-05 MED ORDER — PRAVASTATIN SODIUM 40 MG PO TABS
80.0000 mg | ORAL_TABLET | Freq: Every evening | ORAL | Status: DC
  Administered 2018-05-06: 80 mg via ORAL
  Filled 2018-05-05: qty 2

## 2018-05-05 MED ORDER — IRBESARTAN 300 MG PO TABS
300.0000 mg | ORAL_TABLET | Freq: Every day | ORAL | Status: DC
  Administered 2018-05-06: 300 mg via ORAL
  Filled 2018-05-05: qty 1

## 2018-05-05 MED ORDER — ADULT MULTIVITAMIN W/MINERALS CH
1.0000 | ORAL_TABLET | Freq: Every day | ORAL | Status: DC
  Administered 2018-05-06: 1 via ORAL
  Filled 2018-05-05: qty 1

## 2018-05-05 MED ORDER — SODIUM CHLORIDE 0.9 % IV BOLUS
1000.0000 mL | Freq: Once | INTRAVENOUS | Status: AC
  Administered 2018-05-05: 1000 mL via INTRAVENOUS

## 2018-05-05 MED ORDER — METOPROLOL SUCCINATE ER 25 MG PO TB24
25.0000 mg | ORAL_TABLET | Freq: Every day | ORAL | Status: DC
  Administered 2018-05-06: 25 mg via ORAL
  Filled 2018-05-05: qty 1

## 2018-05-05 MED ORDER — METFORMIN HCL 500 MG PO TABS
500.0000 mg | ORAL_TABLET | Freq: Every day | ORAL | Status: DC
  Filled 2018-05-05: qty 1

## 2018-05-05 NOTE — ED Provider Notes (Signed)
Patient placed in Quick Look pathway, seen and evaluated   Chief Complaint: fatigue  HPI: Melanie Weaver is a 77 y.o. female with hx of HTN who present to the ED with c/o feeling tired. Patient reports she had bronchitis and pneumonia 5 months ago and after finishing antibiotics and doing breathing treatments she continued to feel tired.   ROS: General: fatigue  Physical Exam:  BP (!) 170/79 (BP Location: Right Arm)   Pulse 71   Temp 98.6 F (37 C) (Oral)   Resp 18   Ht 5\' 2"  (1.575 m)   Wt 77.1 kg   SpO2 96%   BMI 31.09 kg/m     Gen: No distress  Neuro: Awake and Alert  Skin: Warm and dry  Abdomen: soft, non tender  Chest: no tenderness with palpation.    Initiation of care has begun. The patient has been counseled on the process, plan, and necessity for staying for the completion/evaluation, and the remainder of the medical screening examination    Ashley Murrain, NP 05/05/18 1652    Tegeler, Gwenyth Allegra, MD 05/06/18 [phone]

## 2018-05-05 NOTE — ED Triage Notes (Signed)
Pt reports bronchitis/pneumonia back in May and has felt fatigued since May after finishing antibiotics and doing breathing txs. Pt reports fatigue only happens when she takes her medications. Pt reports a dizzy spell this afternoon when she got up too quick from the couch. Denies any injuries, denies LOC. Denies pain. Hx of htn, compliant with medications.

## 2018-05-05 NOTE — ED Notes (Signed)
Patient transported to CT 

## 2018-05-05 NOTE — H&P (Signed)
History and Physical    Melanie Weaver ZOX:[id number] DOB: 1940-12-30 DOA: 05/05/2018  PCP: Katherina Mires, MD  Patient coming from: Home  I have personally briefly reviewed patient's old medical records in [place]  Chief Complaint: Fatigue, syncope  HPI: Melanie Weaver is a 77 y.o. female with medical history significant of DM2, HTN, recent diagnosis of COPD following PNA earlier this year.  Patient presents to the ED following a syncopal episode that occurred this evening while she went up to door to greet neighbor who was trick-or-treating.  Got up, went to door, opened door, felt unsteady, and had to sit down.  Doesn't think she had full LOC but dosent recall.  No fall or injury.  She has had fatigue and daily lightheadedness since she had PNA in May 2019.   ED Course: CXR neg, EKG shows sinus rhythm with Bi atrial enlargement, LVH.   Review of Systems: As per HPI otherwise 10 point review of systems negative.   Past Medical History:  Diagnosis Date  . COPD (chronic obstructive pulmonary disease) ([place])   . Diabetes mellitus   . Hypertension     Past Surgical History:  Procedure Laterality Date  . ABDOMINAL HYSTERECTOMY       reports that she has been smoking cigarettes. She has been smoking about 0.25 packs per day. She has never used smokeless tobacco. She reports that she does not drink alcohol or use drugs.  No Known Allergies  No family history on file.   Prior to Admission medications   Medication Sig Start Date End Date Taking? Authorizing Provider  ANORO ELLIPTA 62.5-25 MCG/INH AEPB Inhale 1 puff into the lungs daily. 04/13/18  Yes [provider]  aspirin EC 81 MG tablet Take 81 mg by mouth daily.   Yes [provider]  irbesartan (AVAPRO) 300 MG tablet Take 300 mg by mouth daily.   Yes [provider]  metFORMIN (GLUCOPHAGE) 500 MG tablet Take 500 mg by mouth at bedtime.    Yes [provider]  metoprolol  succinate (TOPROL-XL) 25 MG 24 hr tablet Take 25 mg by mouth daily. 03/01/18  Yes [provider]  Multiple Vitamin (MULTIVITAMIN WITH MINERALS) TABS tablet Take 1 tablet by mouth daily.   Yes [provider]  pravastatin (PRAVACHOL) 80 MG tablet Take 80 mg by mouth every evening.    Yes [provider]  triamterene-hydrochlorothiazide (MAXZIDE-25) 37.5-25 MG per tablet Take 1 tablet by mouth daily.   Yes [provider]    Physical Exam: Vitals:   05/05/18 1644 05/05/18 2000 05/05/18 2030 05/05/18 2200  BP: (!) 170/79 (!) 157/73 (!) 185/82 (!) 156/71  Pulse: 71 65 71 62  Resp: 18 20 20 12   Temp: 98.6 F (37 C)     TempSrc: Oral     SpO2: 96% 97% 97% 97%  Weight:      Height:        Constitutional: NAD, calm, comfortable Eyes: PERRL, lids and conjunctivae normal ENMT: Mucous membranes are moist. Posterior pharynx clear of any exudate or lesions.Normal dentition.  Neck: normal, supple, no masses, no thyromegaly Respiratory: clear to auscultation bilaterally, no wheezing, no crackles. Normal respiratory effort. No accessory muscle use.  Cardiovascular: Regular rate and rhythm, no murmurs / rubs / gallops. No extremity edema. 2+ pedal pulses. No carotid bruits.  Abdomen: no tenderness, no masses palpated. No hepatosplenomegaly. Bowel sounds positive.  Musculoskeletal: no clubbing / cyanosis. No joint deformity upper and lower extremities. Good  ROM, no contractures. Normal muscle tone.  Skin: no rashes, lesions, ulcers. No induration Neurologic: CN 2-12 grossly intact. Sensation intact, DTR normal. Strength 5/5 in all 4.  Psychiatric: Normal judgment and insight. Alert and oriented x 3. Normal mood.    Labs on Admission: I have personally reviewed following labs and imaging studies  CBC: Recent Labs  Lab 05/05/18 1638  WBC 5.0  HGB 17.7*  HCT 55.1*  MCV 79.7*  PLT 400*   Basic Metabolic Panel: Recent Labs  Lab 05/05/18 1638  NA 138  K  3.9  CL 106  CO2 23  GLUCOSE 134*  BUN 27*  CREATININE 0.98  CALCIUM 10.2   GFR: Estimated Creatinine Clearance: 45.5 mL/min (by C-G formula based on SCr of 0.98 mg/dL). Liver Function Tests: No results for input(s): AST, ALT, ALKPHOS, BILITOT, PROT, ALBUMIN in the last 168 hours. No results for input(s): LIPASE, AMYLASE in the last 168 hours. No results for input(s): AMMONIA in the last 168 hours. Coagulation Profile: No results for input(s): INR, PROTIME in the last 168 hours. Cardiac Enzymes: No results for input(s): CKTOTAL, CKMB, CKMBINDEX, TROPONINI in the last 168 hours. BNP (last 3 results) No results for input(s): PROBNP in the last 8760 hours. HbA1C: No results for input(s): HGBA1C in the last 72 hours. CBG: Recent Labs  Lab 05/05/18 1626  GLUCAP 141*   Lipid Profile: No results for input(s): CHOL, HDL, LDLCALC, TRIG, CHOLHDL, LDLDIRECT in the last 72 hours. Thyroid Function Tests: No results for input(s): TSH, T4TOTAL, FREET4, T3FREE, THYROIDAB in the last 72 hours. Anemia Panel: No results for input(s): VITAMINB12, FOLATE, FERRITIN, TIBC, IRON, RETICCTPCT in the last 72 hours. Urine analysis:    Component Value Date/Time   COLORURINE YELLOW 05/05/2018 1938   APPEARANCEUR HAZY (A) 05/05/2018 1938   LABSPEC 1.017 05/05/2018 1938   PHURINE 6.0 05/05/2018 1938   GLUCOSEU NEGATIVE 05/05/2018 1938   HGBUR NEGATIVE 05/05/2018 1938   BILIRUBINUR NEGATIVE 05/05/2018 1938   KETONESUR NEGATIVE 05/05/2018 1938   PROTEINUR NEGATIVE 05/05/2018 1938   UROBILINOGEN 1.0 05/04/2013 1415   NITRITE NEGATIVE 05/05/2018 1938   LEUKOCYTESUR NEGATIVE 05/05/2018 1938    Radiological Exams on Admission: Dg Chest 2 View  Result Date: 05/05/2018 CLINICAL DATA:  Dizzy spells and possible pneumonia. EXAM: CHEST - 2 VIEW COMPARISON:  05/04/2013 FINDINGS: Lungs are adequately inflated with mild linear scarring left midlung. No focal airspace consolidation or effusion.  Cardiomediastinal silhouette and remainder of the exam is unchanged. IMPRESSION: No active cardiopulmonary disease. Electronically Signed   By: Marin Olp M.D.   On: 05/05/2018 21:15   Ct Head Wo Contrast  Result Date: 05/05/2018 CLINICAL DATA:  Bronchitis and pneumonia. Fatigue since May. Altered level of consciousness. EXAM: CT HEAD WITHOUT CONTRAST TECHNIQUE: Contiguous axial images were obtained from the base of the skull through the vertex without intravenous contrast. COMPARISON:  None. FINDINGS: Brain: Superficial atrophy without acute intracranial hemorrhage, intra-axial mass nor extra-axial fluid. Minimal small vessel ischemic changes of periventricular white matter. No hydrocephalus. Midline fourth ventricle basal cisterns without effacement. Cerebellar atrophy is also noted. Vascular: No hyperdense vessel sign. Skull: Intact Sinuses/Orbits: Mucous retention cyst in the left maxillary sinus. Otherwise clear. Intact orbits and globes. Clear bilateral mastoids. Other: Two hyperdense parotid nodules are identified on the left, the largest measuring 1.6 x 1.4 x 2 cm with a smaller posterolateral adjacent nodule measuring approximately 0.5 x 0.5 x 0.9 cm. IMPRESSION: 1. Atrophy without acute intracranial abnormality. 2. Enhancing left-sided parotid nodules,  nonspecific in etiology but may represent enhancing lymph nodes or parotid neoplasm. The largest measures approximately 1.6 x 1.4 x 2 cm. Referral to ENT is recommended for further disposition. Electronically Signed   By: Ashley Royalty M.D.   On: 05/05/2018 20:52    EKG: Independently reviewed.  Assessment/Plan Principal Problem:   Syncope Active Problems:   HTN (hypertension)   DM2 (diabetes mellitus, type 2) (HCC)   COPD (chronic obstructive pulmonary disease) ([place])    1. Syncope - 1. Syncope pathway 2. Tele monitor 3. 2d echo 2. HTN - 1. Continue home BP meds 3. DM2 - continue metformin 4. COPD - continue home nebs  DVT  prophylaxis: Lovenox Code Status: Full Family Communication: Family at bedside Disposition Plan: Home after admit Consults called: None Admission status: Place in [place], [place] Hospitalists Pager [phone] Only works nights!  If 7AM-7PM, please contact the primary day team physician taking care of patient  [email or web address] Password [facility]  05/05/2018, 10:33 PM

## 2018-05-05 NOTE — ED Notes (Signed)
Report attempted 

## 2018-05-05 NOTE — ED Provider Notes (Signed)
[facility] EMERGENCY DEPARTMENT Provider Note   CSN: [id number] Arrival date & time: 05/05/18  1615     History   Chief Complaint Chief Complaint  Patient presents with  . Fatigue    HPI Melanie Weaver is a 77 y.o. female.  HPI  Patient is a 77 year old female with a history of type 2 diabetes, COPD on [place] and hypertension presenting for fatigue, "dizziness", and syncopal episode.  Patient reports that ever since she had pneumonia in May 2019, she has had difficulty recovering her strength.  She is reports that she will experience daily lightheadedness, mostly after she takes her medication.  Patient denies any vertiginous sensation.  She denies any other focal weakness or numbness.  Patient reports that per the instruction of her primary care provider, she began taking her blood pressure medication at 3 different points during the day.  Patient takes 3 antihypertensives.  Patient reports that today, she went up to the door to greet her neighbor who is coming to trigger treat, when she felt unsteady, and believes that she passed out against the couch and the radio.  Patient does not know she hit her head, but denies any head or neck pain at present.  Patient reports that she does not think that she lost full consciousness, but she cannot recall.  Patient denies feeling any chest pain, but she was short of breath at this time.  Patient does not remember palpitations.  No history of CAD.  Patient denies any recent immobilization, hospitalization, hormone use, cancer treatment, recent surgery, history DVT/PE, hemoptysis, lower extremity edema or calf tenderness.  Patient has not had any recent events suggestive of volume depletion, denies any recent nausea, vomiting, or diarrhea.  Patient reports she feels back to her baseline at present.  Past Medical History:  Diagnosis Date  . Diabetes mellitus   . Hypertension     There are no active problems to display for this  patient.   Past Surgical History:  Procedure Laterality Date  . ABDOMINAL HYSTERECTOMY       OB History   None      Home Medications    Prior to Admission medications   Medication Sig Start Date End Date Taking? Authorizing Provider  aspirin EC 81 MG tablet Take 81 mg by mouth daily.    [provider]  lisinopril (PRINIVIL,ZESTRIL) 40 MG tablet Take 40 mg by mouth daily.    [provider]  metFORMIN (GLUCOPHAGE) 500 MG tablet Take 500 mg by mouth 2 (two) times daily with a meal.    [provider]  Multiple Vitamin (MULTIVITAMIN WITH MINERALS) TABS tablet Take 1 tablet by mouth daily.    [provider]  sucralfate (CARAFATE) 1 G tablet Take 1 tablet (1 g total) by mouth 4 (four) times daily. 05/04/13   Lacretia Leigh, MD  triamterene-hydrochlorothiazide (MAXZIDE-25) 37.5-25 MG per tablet Take 1 tablet by mouth daily.    [provider]    Family History No family history on file.  Social History Social History   Tobacco Use  . Smoking status: Current Every Day Smoker    Packs/day: 0.25    Types: Cigarettes  . Smokeless tobacco: Never Used  Substance Use Topics  . Alcohol use: No  . Drug use: No     Allergies   Patient has no known allergies.   Review of Systems Review of Systems  Constitutional: Negative for chills and fever.  HENT: Negative for congestion and sore  throat.   Eyes: Negative for visual disturbance.  Respiratory: Negative for cough, chest tightness and shortness of breath.   Cardiovascular: Negative for chest pain, palpitations and leg swelling.  Gastrointestinal: Negative for abdominal pain, nausea and vomiting.  Genitourinary: Negative for dysuria and flank pain.  Musculoskeletal: Negative for back pain and myalgias.  Skin: Negative for rash.  Neurological: Positive for syncope and light-headedness. Negative for dizziness and headaches.     Physical Exam Updated Vital Signs BP (!) 185/82    Pulse 71   Temp 98.6 F (37 C) (Oral)   Resp 20   Ht 5\' 2"  (1.575 m)   Wt 77.1 kg   SpO2 97%   BMI 31.09 kg/m   Physical Exam  Constitutional: She appears well-developed and well-nourished. No distress.  HENT:  Head: Normocephalic and atraumatic.  Mouth/Throat: Oropharynx is clear and moist.  Eyes: Pupils are equal, round, and reactive to light. Conjunctivae and EOM are normal.  Neck: Normal range of motion. Neck supple.  Cardiovascular: Normal rate, regular rhythm, S1 normal and S2 normal.  No murmur heard. Pulses:      Radial pulses are 2+ on the right side, and 2+ on the left side.       Popliteal pulses are 2+ on the right side, and 2+ on the left side.  No lower extremity edema or calf tenderness.  Pulmonary/Chest: Effort normal and breath sounds normal. She has no wheezes. She has no rales.  Abdominal: Soft. She exhibits no distension. There is no tenderness. There is no guarding.  Musculoskeletal: Normal range of motion. She exhibits no edema or deformity.  Neurological: She is alert.  Mental Status:  Alert, oriented, thought content appropriate, able to give a coherent history. Speech fluent without evidence of aphasia. Able to follow 2 step commands without difficulty.  Cranial Nerves:  II:  Peripheral visual fields grossly normal, pupils equal, round, reactive to light III,IV, VI: ptosis not present, extra-ocular motions intact bilaterally  V,VII: smile symmetric, facial light touch sensation equal VIII: hearing grossly normal to voice  X: uvula elevates symmetrically  XI: bilateral shoulder shrug symmetric and strong XII: midline tongue extension without fassiculations Motor:  Normal tone. 5/5 in upper and lower extremities bilaterally including strong and equal grip strength and dorsiflexion/plantar flexion Sensory: Light touch normal in all extremities.  Cerebellar: normal finger-to-nose with bilateral upper extremities Gait: normal gait and balance Stance:  No  pronator drift and good coordination, strength, and position sense with tapping of bilateral arms (performed in sitting position). CV: distal pulses palpable throughout   Skin: Skin is warm and dry. No rash noted. No erythema.  Psychiatric: She has a normal mood and affect. Her behavior is normal. Judgment and thought content normal.  Nursing note and vitals reviewed.    ED Treatments / Results  Labs (all labs ordered are listed, but only abnormal results are displayed) Labs Reviewed  BASIC METABOLIC PANEL - Abnormal; Notable for the following components:      Result Value   Glucose, Bld 134 (*)    BUN 27 (*)    GFR calc non Af Amer 54 (*)    All other components within normal limits  CBC - Abnormal; Notable for the following components:   RBC 6.91 (*)    Hemoglobin 17.7 (*)    HCT 55.1 (*)    MCV 79.7 (*)    MCH 25.6 (*)    RDW 17.0 (*)    Platelets 147 (*)  All other components within normal limits  CBG MONITORING, ED - Abnormal; Notable for the following components:   Glucose-Capillary 141 (*)    All other components within normal limits  URINALYSIS, ROUTINE W REFLEX MICROSCOPIC  CBG MONITORING, ED  I-STAT TROPONIN, ED    EKG EKG Interpretation  Date/Time:  Thursday May 05 2018 16:25:30 EDT Ventricular Rate:  77 PR Interval:  184 QRS Duration: 86 QT Interval:  362 QTC Calculation: 409 R Axis:   -31 Text Interpretation:  Sinus rhythm with occasional Premature ventricular complexes Biatrial enlargement Left axis deviation Pulmonary disease pattern Left ventricular hypertrophy with repolarization abnormality Cannot rule out Septal infarct , age undetermined Abnormal ECG No old tracing to compare Confirmed by Pattricia Boss [phone]) on 05/05/2018 9:22:36 PM   Radiology Ct Head Wo Contrast  Result Date: 05/05/2018 CLINICAL DATA:  Bronchitis and pneumonia. Fatigue since May. Altered level of consciousness. EXAM: CT HEAD WITHOUT CONTRAST TECHNIQUE: Contiguous axial  images were obtained from the base of the skull through the vertex without intravenous contrast. COMPARISON:  None. FINDINGS: Brain: Superficial atrophy without acute intracranial hemorrhage, intra-axial mass nor extra-axial fluid. Minimal small vessel ischemic changes of periventricular white matter. No hydrocephalus. Midline fourth ventricle basal cisterns without effacement. Cerebellar atrophy is also noted. Vascular: No hyperdense vessel sign. Skull: Intact Sinuses/Orbits: Mucous retention cyst in the left maxillary sinus. Otherwise clear. Intact orbits and globes. Clear bilateral mastoids. Other: Two hyperdense parotid nodules are identified on the left, the largest measuring 1.6 x 1.4 x 2 cm with a smaller posterolateral adjacent nodule measuring approximately 0.5 x 0.5 x 0.9 cm. IMPRESSION: 1. Atrophy without acute intracranial abnormality. 2. Enhancing left-sided parotid nodules, nonspecific in etiology but may represent enhancing lymph nodes or parotid neoplasm. The largest measures approximately 1.6 x 1.4 x 2 cm. Referral to ENT is recommended for further disposition. Electronically Signed   By: Ashley Royalty M.D.   On: 05/05/2018 20:52    Procedures Procedures (including critical care time)  Medications Ordered in ED Medications  sodium chloride 0.9 % bolus 1,000 mL (1,000 mLs Intravenous New Bag/Given 05/05/18 2025)     Initial Impression / Assessment and Plan / ED Course  I have reviewed the triage vital signs and the nursing notes.  Pertinent labs & imaging results that were available during my care of the patient were reviewed by me and considered in my medical decision making (see chart for details).     DDx includes: Orthostatic hypotension Stroke Vertebral artery dissection/stenosis Dysrhythmia PE Vasovagal/neurocardiogenic syncope Aortic stenosis Valvular disorder/Cardiomyopathy Anemia  Patient is nontoxic-appearing, neurologically intact, and in no acute distress.  To  orthostatic hypotension, patient does not have any historical features suggestive of volume depletion.  Do not suspect pulmonary embolism, the patient is not having any active chest pain or shortness of breath, has had no recent surgery, hospitalization, immobilization.  Given the ongoing nature of patient's symptoms, and the unclear nature of her episode of loss of conscious today, cardiac arrhythmia not ruled out.  EKG showing sinus rhythm with occasional PVCs, and possible septal hypertrophy.  No prior for comparison.  Chest x-ray without acute cardiopulmonary abnormality.  Previous abnormalities appear to have resolved on this image.  CT head without acute parenchymal abnormality, however patient does have a possible nodule of the left parotid gland.  Patient has noted periodic swelling.  Patient was given a printout of her report, and instructed that she will need to follow-up with ENT after discharge.  On-call provider Dr.  Constance Holster but case not discussed with this physician.   This is a supervised visit with Dr. Andee Poles ray. Evaluation, management, and discharge planning discussed with this attending physician.  Final Clinical Impressions(s) / ED Diagnoses   Final diagnoses:  Altered level of consciousness  Elevated hemoglobin (HCC)  Elevated BUN  Mass of left parotid gland    ED Discharge Orders    None       Tamala Julian 05/06/18 0015    Pattricia Boss, MD 05/06/18 1357

## 2018-05-05 NOTE — ED Notes (Signed)
Pt ambulated to bathroom without difficulty.

## 2018-05-06 ENCOUNTER — Observation Stay (HOSPITAL_BASED_OUTPATIENT_CLINIC_OR_DEPARTMENT_OTHER): Payer: Medicare Other

## 2018-05-06 ENCOUNTER — Encounter (HOSPITAL_COMMUNITY): Payer: Self-pay

## 2018-05-06 DIAGNOSIS — E119 Type 2 diabetes mellitus without complications: Secondary | ICD-10-CM

## 2018-05-06 DIAGNOSIS — K118 Other diseases of salivary glands: Secondary | ICD-10-CM

## 2018-05-06 DIAGNOSIS — R55 Syncope and collapse: Secondary | ICD-10-CM | POA: Diagnosis not present

## 2018-05-06 DIAGNOSIS — I5030 Unspecified diastolic (congestive) heart failure: Secondary | ICD-10-CM

## 2018-05-06 LAB — BASIC METABOLIC PANEL
Anion gap: 6 (ref 5–15)
BUN: 21 mg/dL (ref 8–23)
CALCIUM: 8.7 mg/dL — AB (ref 8.9–10.3)
CHLORIDE: 110 mmol/L (ref 98–111)
CO2: 23 mmol/L (ref 22–32)
CREATININE: 0.9 mg/dL (ref 0.44–1.00)
GFR calc Af Amer: >60 mL/min (ref >60)
GFR calc non Af Amer: 60 mL/min — ABNORMAL LOW (ref >60)
GLUCOSE: 126 mg/dL — AB (ref 70–99)
Potassium: 3.7 mmol/L (ref 3.5–5.1)
Sodium: 139 mmol/L (ref 135–145)

## 2018-05-06 LAB — CBC
HEMATOCRIT: 51.3 % — AB (ref 36.0–46.0)
HEMOGLOBIN: 16.4 g/dL — AB (ref 12.0–15.0)
MCH: 25.4 pg — AB (ref 26.0–34.0)
MCHC: 32 g/dL (ref 30.0–36.0)
MCV: 79.5 fL — AB (ref 80.0–100.0)
Platelets: 148 10*3/uL — ABNORMAL LOW (ref 150–400)
RBC: 6.45 MIL/uL — ABNORMAL HIGH (ref 3.87–5.11)
RDW: 16.7 % — AB (ref 11.5–15.5)
WBC: 4.2 10*3/uL (ref 4.0–10.5)
nRBC: 0 % (ref 0.0–0.2)

## 2018-05-06 LAB — ECHOCARDIOGRAM COMPLETE
HEIGHTINCHES: 62 in
Weight: 2667.2 oz

## 2018-05-06 LAB — GLUCOSE, CAPILLARY
GLUCOSE-CAPILLARY: 127 mg/dL — AB (ref 70–99)
Glucose-Capillary: 102 mg/dL — ABNORMAL HIGH (ref 70–99)

## 2018-05-06 LAB — MAGNESIUM: Magnesium: 1.8 mg/dL (ref 1.7–2.4)

## 2018-05-06 MED ORDER — SODIUM CHLORIDE 0.9 % IV SOLN
INTRAVENOUS | Status: DC
  Administered 2018-05-06: 10:00:00 via INTRAVENOUS

## 2018-05-06 NOTE — Progress Notes (Signed)
  Echocardiogram 2D Echocardiogram has been performed.  Alex Mcmanigal L Androw 05/06/2018, 9:35 AM

## 2018-05-06 NOTE — Progress Notes (Signed)
Pt received to Rm 18. VSS with no distress noted . Belongings policy explained and fall education completed.Pt  verbalized how call for assistance. Skin intact.

## 2018-05-06 NOTE — Progress Notes (Signed)
Patient and family at bedside given discharge instructions. Patient expressed understanding. IV discontinued without complications. Patient taken down to main entrance via wheelchair, being driven home by family. Belonging taken down by family.

## 2018-05-06 NOTE — Discharge Summary (Signed)
Physician Discharge Summary  Melanie Weaver WGN:[id number] DOB: 12/08/1940 DOA: 05/05/2018  PCP: Katherina Mires, MD  Admit date: 05/05/2018 Discharge date: 05/06/2018  Admitted From: home Discharge disposition: home   Recommendations for Outpatient Follow-Up:   Encouraged regular meals, fluid intake -holding Maxzide 25 for now -change positions slowly to avoid drops in BP ENT referral for CT findings-- ? parotid mass  Discharge Diagnosis:   Principal Problem:   Syncope Active Problems:   HTN (hypertension)   DM2 (diabetes mellitus, type 2) ([place])   COPD (chronic obstructive pulmonary disease) ([place])    Discharge Condition: Improved.  Diet recommendation: Low sodium, heart healthy.  Carbohydrate-modified.  Wound care: None.  Code status: Full.   History of Present Illness:   Melanie Weaver is a 77 y.o. female with medical history significant of DM2, HTN, recent diagnosis of COPD following PNA earlier this year.  Patient presents to the ED following a syncopal episode that occurred this evening while she went up to door to greet neighbor who was trick-or-treating.  Got up, went to door, opened door, felt unsteady, and had to sit down.  Doesn't think she had full LOC but dosent recall.  No fall or injury.  Hospital Course by Problem:   Near Syncope -appears orthostatic -IVF -patient does not eat regular meals/does not drink much liquid as she is caring for her disabled son -hold diuretic -close follow up with PCP -echo: - Normal LV size with severe LV hypertrophy. EF 55-60%. Normal RV   size and systolic function. No significant valvular   abnormalities. ? Event monitor if continues to occur after increased PO intake  Medical Consultants:      Discharge Exam:   Vitals:   05/06/18 1019 05/06/18 1249  BP: 128/64   Pulse: (!) 58   Resp:    Temp:    SpO2:  100%   Vitals:   05/06/18 0343 05/06/18 0824 05/06/18 1019 05/06/18 1249  BP: 123/65   128/64   Pulse: (!) 58  (!) 58   Resp: 16     Temp: 97.9 F (36.6 C)     TempSrc: Oral     SpO2: 95% 96%  100%  Weight:      Height:        General exam: Appears calm and comfortable. Feeling better and ready to go home    The results of significant diagnostics from this hospitalization (including imaging, microbiology, ancillary and laboratory) are listed below for reference.     Procedures and Diagnostic Studies:   Dg Chest 2 View  Result Date: 05/05/2018 CLINICAL DATA:  Dizzy spells and possible pneumonia. EXAM: CHEST - 2 VIEW COMPARISON:  05/04/2013 FINDINGS: Lungs are adequately inflated with mild linear scarring left midlung. No focal airspace consolidation or effusion. Cardiomediastinal silhouette and remainder of the exam is unchanged. IMPRESSION: No active cardiopulmonary disease. Electronically Signed   By: Marin Olp M.D.   On: 05/05/2018 21:15   Ct Head Wo Contrast  Result Date: 05/05/2018 CLINICAL DATA:  Bronchitis and pneumonia. Fatigue since May. Altered level of consciousness. EXAM: CT HEAD WITHOUT CONTRAST TECHNIQUE: Contiguous axial images were obtained from the base of the skull through the vertex without intravenous contrast. COMPARISON:  None. FINDINGS: Brain: Superficial atrophy without acute intracranial hemorrhage, intra-axial mass nor extra-axial fluid. Minimal small vessel ischemic changes of periventricular white matter. No hydrocephalus. Midline fourth ventricle basal cisterns without effacement. Cerebellar atrophy is also noted. Vascular: No hyperdense vessel sign. Skull: Intact  Sinuses/Orbits: Mucous retention cyst in the left maxillary sinus. Otherwise clear. Intact orbits and globes. Clear bilateral mastoids. Other: Two hyperdense parotid nodules are identified on the left, the largest measuring 1.6 x 1.4 x 2 cm with a smaller posterolateral adjacent nodule measuring approximately 0.5 x 0.5 x 0.9 cm. IMPRESSION: 1. Atrophy without acute intracranial  abnormality. 2. Enhancing left-sided parotid nodules, nonspecific in etiology but may represent enhancing lymph nodes or parotid neoplasm. The largest measures approximately 1.6 x 1.4 x 2 cm. Referral to ENT is recommended for further disposition. Electronically Signed   By: Ashley Royalty M.D.   On: 05/05/2018 20:52     Labs:   Basic Metabolic Panel: Recent Labs  Lab 05/05/18 1638 05/06/18 1141  NA 138 139  K 3.9 3.7  CL 106 110  CO2 23 23  GLUCOSE 134* 126*  BUN 27* 21  CREATININE 0.98 0.90  CALCIUM 10.2 8.7*   GFR Estimated Creatinine Clearance: 49 mL/min (by C-G formula based on SCr of 0.9 mg/dL). Liver Function Tests: No results for input(s): AST, ALT, ALKPHOS, BILITOT, PROT, ALBUMIN in the last 168 hours. No results for input(s): LIPASE, AMYLASE in the last 168 hours. No results for input(s): AMMONIA in the last 168 hours. Coagulation profile No results for input(s): INR, PROTIME in the last 168 hours.  CBC: Recent Labs  Lab 05/05/18 1638 05/06/18 1141  WBC 5.0 4.2  HGB 17.7* 16.4*  HCT 55.1* 51.3*  MCV 79.7* 79.5*  PLT 147* 148*   Cardiac Enzymes: No results for input(s): CKTOTAL, CKMB, CKMBINDEX, TROPONINI in the last 168 hours. BNP: Invalid input(s): POCBNP CBG: Recent Labs  Lab 05/05/18 1626 05/06/18 0546 05/06/18 0815  GLUCAP 141* 102* 127*   D-Dimer No results for input(s): DDIMER in the last 72 hours. Hgb A1c No results for input(s): HGBA1C in the last 72 hours. Lipid Profile No results for input(s): CHOL, HDL, LDLCALC, TRIG, CHOLHDL, LDLDIRECT in the last 72 hours. Thyroid function studies No results for input(s): TSH, T4TOTAL, T3FREE, THYROIDAB in the last 72 hours.  Invalid input(s): FREET3 Anemia work up No results for input(s): VITAMINB12, FOLATE, FERRITIN, TIBC, IRON, RETICCTPCT in the last 72 hours. Microbiology No results found for this or any previous visit (from the past 240 hour(s)).   Discharge Instructions:   Discharge  Instructions    Diet - low sodium heart healthy   Complete by:  As directed    Diet Carb Modified   Complete by:  As directed    Discharge instructions   Complete by:  As directed    Hold your BP medication: maxzide 25 until seen by PCP -change positions slowly -be sure to eat and drink on a regular basis Compression stockings can help with orthostasis   Increase activity slowly   Complete by:  As directed      Allergies as of 05/06/2018   No Known Allergies     Medication List    STOP taking these medications   triamterene-hydrochlorothiazide 37.5-25 MG tablet Commonly known as:  MAXZIDE-25     TAKE these medications   ANORO ELLIPTA 62.5-25 MCG/INH Aepb Generic drug:  umeclidinium-vilanterol Inhale 1 puff into the lungs daily.   aspirin EC 81 MG tablet Take 81 mg by mouth daily.   irbesartan 300 MG tablet Commonly known as:  AVAPRO Take 300 mg by mouth daily.   metFORMIN 500 MG tablet Commonly known as:  GLUCOPHAGE Take 500 mg by mouth at bedtime.   metoprolol succinate 25 MG  24 hr tablet Commonly known as:  TOPROL-XL Take 25 mg by mouth daily.   multivitamin with minerals Tabs tablet Take 1 tablet by mouth daily.   pravastatin 80 MG tablet Commonly known as:  PRAVACHOL Take 80 mg by mouth every evening.      Follow-up Information    Katherina Mires, MD Follow up in 1 week(s).   Specialty:  Family Medicine Why:  for BP check and medication adjustment need both cardiology as well as pulmonology follow up Contact information: [place] [id number] [phone]            Time coordinating discharge: 25 min  Signed:  [facility] 05/06/2018, 1:49 PM

## 2018-05-09 ENCOUNTER — Ambulatory Visit (HOSPITAL_COMMUNITY)
Admission: EM | Admit: 2018-05-09 | Discharge: 2018-05-09 | Disposition: A | Discharge status: Home / Self Care | Payer: Medicare Other | Source: Home / Self Care | Attending: Family Medicine | Admitting: Family Medicine

## 2018-05-09 ENCOUNTER — Other Ambulatory Visit: Payer: Self-pay

## 2018-05-09 ENCOUNTER — Emergency Department (HOSPITAL_COMMUNITY)
Admission: EM | Admit: 2018-05-09 | Discharge: 2018-05-09 | Disposition: A | Discharge status: Home / Self Care | Payer: Medicare Other | Attending: Emergency Medicine | Admitting: Emergency Medicine

## 2018-05-09 ENCOUNTER — Encounter (HOSPITAL_COMMUNITY): Payer: Self-pay | Admitting: *Deleted

## 2018-05-09 ENCOUNTER — Emergency Department (HOSPITAL_COMMUNITY): Payer: Medicare Other

## 2018-05-09 DIAGNOSIS — K1120 Sialoadenitis, unspecified: Secondary | ICD-10-CM | POA: Diagnosis not present

## 2018-05-09 DIAGNOSIS — E119 Type 2 diabetes mellitus without complications: Secondary | ICD-10-CM | POA: Insufficient documentation

## 2018-05-09 DIAGNOSIS — F17210 Nicotine dependence, cigarettes, uncomplicated: Secondary | ICD-10-CM | POA: Insufficient documentation

## 2018-05-09 DIAGNOSIS — Z79899 Other long term (current) drug therapy: Secondary | ICD-10-CM | POA: Insufficient documentation

## 2018-05-09 DIAGNOSIS — I10 Essential (primary) hypertension: Secondary | ICD-10-CM | POA: Diagnosis not present

## 2018-05-09 DIAGNOSIS — Z7982 Long term (current) use of aspirin: Secondary | ICD-10-CM | POA: Diagnosis not present

## 2018-05-09 DIAGNOSIS — Z7984 Long term (current) use of oral hypoglycemic drugs: Secondary | ICD-10-CM | POA: Diagnosis not present

## 2018-05-09 DIAGNOSIS — R220 Localized swelling, mass and lump, head: Secondary | ICD-10-CM

## 2018-05-09 DIAGNOSIS — R600 Localized edema: Secondary | ICD-10-CM | POA: Diagnosis present

## 2018-05-09 DIAGNOSIS — G501 Atypical facial pain: Secondary | ICD-10-CM

## 2018-05-09 DIAGNOSIS — K118 Other diseases of salivary glands: Secondary | ICD-10-CM | POA: Diagnosis not present

## 2018-05-09 DIAGNOSIS — J449 Chronic obstructive pulmonary disease, unspecified: Secondary | ICD-10-CM | POA: Diagnosis not present

## 2018-05-09 DIAGNOSIS — R1111 Vomiting without nausea: Secondary | ICD-10-CM

## 2018-05-09 LAB — BASIC METABOLIC PANEL
Anion gap: 10 (ref 5–15)
BUN: 12 mg/dL (ref 8–23)
CO2: 28 mmol/L (ref 22–32)
Calcium: 9.7 mg/dL (ref 8.9–10.3)
Chloride: 100 mmol/L (ref 98–111)
Creatinine, Ser: 1.05 mg/dL — ABNORMAL HIGH (ref 0.44–1.00)
GFR calc Af Amer: 57 mL/min — ABNORMAL LOW (ref >60)
GFR calc non Af Amer: 50 mL/min — ABNORMAL LOW (ref >60)
Glucose, Bld: 121 mg/dL — ABNORMAL HIGH (ref 70–99)
Potassium: 3.9 mmol/L (ref 3.5–5.1)
Sodium: 138 mmol/L (ref 135–145)

## 2018-05-09 LAB — CBC WITH DIFFERENTIAL/PLATELET
Abs Immature Granulocytes: 0.02 10*3/uL (ref 0.00–0.07)
Basophils Absolute: 0 10*3/uL (ref 0.0–0.1)
Basophils Relative: 1 %
Eosinophils Absolute: 0.1 10*3/uL (ref 0.0–0.5)
Eosinophils Relative: 1 %
HCT: 56.9 % — ABNORMAL HIGH (ref 36.0–46.0)
Hemoglobin: 17.5 g/dL — ABNORMAL HIGH (ref 12.0–15.0)
Immature Granulocytes: 0 %
Lymphocytes Relative: 21 %
Lymphs Abs: 1.6 10*3/uL (ref 0.7–4.0)
MCH: 25 pg — ABNORMAL LOW (ref 26.0–34.0)
MCHC: 30.8 g/dL (ref 30.0–36.0)
MCV: 81.3 fL (ref 80.0–100.0)
Monocytes Absolute: 0.6 10*3/uL (ref 0.1–1.0)
Monocytes Relative: 8 %
Neutro Abs: 5.2 10*3/uL (ref 1.7–7.7)
Neutrophils Relative %: 69 %
Platelets: 131 10*3/uL — ABNORMAL LOW (ref 150–400)
RBC: 7 MIL/uL — ABNORMAL HIGH (ref 3.87–5.11)
RDW: 16.9 % — ABNORMAL HIGH (ref 11.5–15.5)
WBC: 7.5 10*3/uL (ref 4.0–10.5)
nRBC: 0 % (ref 0.0–0.2)

## 2018-05-09 MED ORDER — KETOROLAC TROMETHAMINE 30 MG/ML IJ SOLN
INTRAMUSCULAR | Status: AC
  Filled 2018-05-09: qty 1

## 2018-05-09 MED ORDER — AMOXICILLIN-POT CLAVULANATE 875-125 MG PO TABS
1.0000 | ORAL_TABLET | Freq: Once | ORAL | Status: AC
  Administered 2018-05-09: 1 via ORAL
  Filled 2018-05-09: qty 1

## 2018-05-09 MED ORDER — IOHEXOL 300 MG/ML  SOLN
75.0000 mL | Freq: Once | INTRAMUSCULAR | Status: AC | PRN
  Administered 2018-05-09: 75 mL via INTRAVENOUS

## 2018-05-09 MED ORDER — HYDROCODONE-ACETAMINOPHEN 5-325 MG PO TABS: 1.0000 | ORAL_TABLET | ORAL | 0 refills | Status: DC | PRN

## 2018-05-09 MED ORDER — KETOROLAC TROMETHAMINE 30 MG/ML IJ SOLN
15.0000 mg | Freq: Once | INTRAMUSCULAR | Status: AC
  Administered 2018-05-09: 15 mg via INTRAMUSCULAR

## 2018-05-09 MED ORDER — ONDANSETRON 4 MG PO TBDP
4.0000 mg | ORAL_TABLET | Freq: Once | ORAL | Status: AC
  Administered 2018-05-09: 4 mg via ORAL

## 2018-05-09 MED ORDER — AMOXICILLIN-POT CLAVULANATE 875-125 MG PO TABS: 1.0000 | ORAL_TABLET | Freq: Two times a day (BID) | ORAL | 0 refills | Status: DC

## 2018-05-09 MED ORDER — ONDANSETRON 4 MG PO TBDP
ORAL_TABLET | ORAL | Status: AC
  Filled 2018-05-09: qty 1

## 2018-05-09 NOTE — ED Provider Notes (Signed)
Patient placed in Quick Look pathway, seen and evaluated   Chief Complaint: facial pain  HPI: Melanie Weaver is a 77 y.o. female who presents to the ED form Urgent Care with facial pain and swelling. Patient here to see ENT for further evaluation of left side facial swelling. Patient given Toradol at Urgent Care and reports it did help the pain.   ROS: ENT: facial pain, swelling  Physical Exam:  BP (!) 175/51 (BP Location: Right Arm)   Pulse 64   Temp 98.9 F (37.2 C) (Oral)   Resp 18   SpO2 97%    Gen: No distress  Neuro: Awake and Alert  Skin: Warm and dry  ENT: left side of face with swelling and tenderness.    Initiation of care has begun. The patient has been counseled on the process, plan, and necessity for staying for the completion/evaluation, and the remainder of the medical screening examination    Ashley Murrain, NP 05/09/18 [place]    Julianne Rice, MD 05/11/18 [phone]

## 2018-05-09 NOTE — Discharge Instructions (Addendum)
Go to the ER to see ENT today I spoke to Tiffany at the ENT office and she recommends evaluation by Dr Janace Hoard

## 2018-05-09 NOTE — ED Triage Notes (Signed)
States she was discharged from hospital on Friday after a syncopal episode. C/o knot on the left side of her face, of while she has had x 1 year.

## 2018-05-09 NOTE — ED Provider Notes (Signed)
[place]    CSN: [id number] Arrival date & time: 05/09/18  1231     History   Chief Complaint Chief Complaint  Patient presents with  . Facial Swelling    HPI Melanie Weaver is a 77 y.o. female.   HPI  Melanie Weaver was just hospitalized from 10 30/2019 to 05/06/2018 for a syncopal episode.  CT head was done as part of the work-up did identify she had to be asymptomatic hyper dense parotid nodules.  It was recommended that she follow-up with ENT.  She states that she has been feeling very weak.  This morning she woke up with a very painful swollen face, nausea and vomiting.  She cannot open her mouth very well.  She states that her face hurts too much for her to eat.  No fever or chills.  No abdominal pain cramping or diarrhea. She is here with her daughter.  They are unaware that she has growths in her parotid, and that this is pre-existing.  I did pull up and showed them the CT scan and report.   Past Medical History:  Diagnosis Date  . COPD (chronic obstructive pulmonary disease) ([place])   . Diabetes mellitus   . Hypertension     Patient Active Problem List   Diagnosis Date Noted  . Syncope 05/05/2018  . HTN (hypertension) 05/05/2018  . DM2 (diabetes mellitus, type 2) ([place]) 05/05/2018  . COPD (chronic obstructive pulmonary disease) ([place]) 05/05/2018    Past Surgical History:  Procedure Laterality Date  . ABDOMINAL HYSTERECTOMY      OB History   None      Home Medications    Prior to Admission medications   Medication Sig Start Date End Date Taking? Authorizing Provider  ANORO ELLIPTA 62.5-25 MCG/INH AEPB Inhale 1 puff into the lungs daily. 04/13/18   [provider]  aspirin EC 81 MG tablet Take 81 mg by mouth daily.    [provider]  irbesartan (AVAPRO) 300 MG tablet Take 300 mg by mouth daily.    [provider]  metFORMIN (GLUCOPHAGE) 500 MG tablet Take 500 mg by mouth at bedtime.     [provider]    metoprolol succinate (TOPROL-XL) 25 MG 24 hr tablet Take 25 mg by mouth daily. 03/01/18   [provider]  Multiple Vitamin (MULTIVITAMIN WITH MINERALS) TABS tablet Take 1 tablet by mouth daily.    [provider]  pravastatin (PRAVACHOL) 80 MG tablet Take 80 mg by mouth every evening.     [provider]    Family History No family history on file. Denies cancers Mother had glaucoma and hypertension Social History Social History   Tobacco Use  . Smoking status: Current Every Day Smoker    Packs/day: 0.25    Types: Cigarettes  . Smokeless tobacco: Never Used  Substance Use Topics  . Alcohol use: No  . Drug use: No     Allergies   Patient has no known allergies.   Review of Systems Review of Systems  Constitutional: Positive for fatigue. Negative for chills, diaphoresis and fever.       Worse than usual - present for months  HENT: Positive for trouble swallowing. Negative for ear pain and sore throat.        Cannot open mouth or chew  Eyes: Negative for pain and visual disturbance.  Respiratory: Negative for cough and shortness of breath.   Cardiovascular: Negative for chest pain and palpitations.  Gastrointestinal: Positive for  nausea. Negative for abdominal pain and vomiting.  Genitourinary: Negative for dysuria and hematuria.  Musculoskeletal: Negative for arthralgias and back pain.  Skin: Negative for color change and rash.  Neurological: Positive for facial asymmetry. Negative for seizures and syncope.       Swollen L face  Psychiatric/Behavioral: Positive for sleep disturbance.  All other systems reviewed and are negative.    Physical Exam Triage Vital Signs ED Triage Vitals  Enc Vitals Group     BP 05/09/18 1357 (!) 180/76     Pulse Rate 05/09/18 1357 68     Resp 05/09/18 1357 18     Temp 05/09/18 1357 98.6 F (37 C)     Temp Source 05/09/18 1357 Oral     SpO2 05/09/18 1357 93 %     Weight --      Height --      Head  Circumference --      Peak Flow --      Pain Score 05/09/18 1403 8     Pain Loc --      Pain Edu? --      Excl. in [place]? --    No data found.  Updated Vital Signs BP (!) 179/80 (BP Location: Left Arm)   Pulse 63   Temp 98.6 F (37 C) (Oral)   Resp 18   SpO2 94%       Physical Exam  Constitutional: She appears well-developed and well-nourished. She appears distressed.  In pain, holding L cheek. Rocking in wheelchair  HENT:  Head: Normocephalic and atraumatic.    Right Ear: External ear normal.  Left Ear: External ear normal.  Mouth/Throat: Oropharynx is clear and moist.  Can only open mouth 3 cm at incisors  Eyes: Pupils are equal, round, and reactive to light. Conjunctivae are normal.  Neck: Normal range of motion.  Cardiovascular: Normal rate, regular rhythm and normal heart sounds.  Pulmonary/Chest: Effort normal and breath sounds normal. No respiratory distress.  Abdominal: Soft. She exhibits no distension.  Musculoskeletal: Normal range of motion. She exhibits no edema.  Lymphadenopathy:    She has no cervical adenopathy.  Neurological: She is alert.  Skin: Skin is warm and dry.  Psychiatric: She has a normal mood and affect.     UC Treatments / Results  Labs (all labs ordered are listed, but only abnormal results are displayed) Labs Reviewed - No data to display  EKG None  Radiology No results found.  Procedures Procedures (including critical care time)  Medications Ordered in UC Medications  ondansetron (ZOFRAN-ODT) disintegrating tablet 4 mg (4 mg Oral Given 05/09/18 1437)  ketorolac (TORADOL) 30 MG/ML injection 15 mg (15 mg Intramuscular Given 05/09/18 1510)    Initial Impression / Assessment and Plan / UC Course  I have reviewed the triage vital signs and the nursing notes.  Pertinent labs & imaging results that were available during my care of the patient were reviewed by me and considered in my medical decision making (see chart for  details).     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 Not Applicable   Raylene Everts, MD 05/09/18 [phone]

## 2018-05-09 NOTE — ED Triage Notes (Signed)
Patient to ED from [facility] to see ENT for further evaluation of L-sided facial swelling onset last night. Denies fevers/chills. Given Toradol at [facility] with relief.

## 2018-05-09 NOTE — Discharge Instructions (Addendum)
Please read attached information. If you experience any new or worsening signs or symptoms please return to the emergency room for evaluation. Please follow-up with your primary care provider or specialist as discussed. Please use medication prescribed only as directed and discontinue taking if you have any concerning signs or symptoms.   °

## 2018-05-09 NOTE — ED Provider Notes (Signed)
[facility] EMERGENCY DEPARTMENT Provider Note   CSN: [id number] Arrival date & time: 05/09/18  1533     History   Chief Complaint Chief Complaint  Patient presents with  . Facial Swelling    HPI Melanie Weaver is a 77 y.o. female.  HPI   78- year-old female presents today with complaints of left-sided facial swelling.  Patient notes that she was hospitalized on 05/04/2018 to 05/06/2018 for syncopal episode.  Patient notes at that time she had no swelling to the left side of her face but was discharged and noted swelling thereafter.  She notes over the last several days she has had worsening swelling and pain with difficulty opening her mouth.  She notes she was able to eat yesterday, unable to eat today secondary to pain.  The time of hospitalization she had incidental finding hyperdense parotid nodules with outpatient recommendation.  Patient notes some nausea earlier which resolved with antiemetics prior to my evaluation.  She notes that yesterday she did have swelling and was able to eat and had no significant worsening pain with eating.  Denies any infectious symptoms, denies any close sick contacts.  Patient was seen in urgent care, they spoke with ENT who recommended coming to the emergency room for evaluation by on-call physician Dr. Janace Hoard.  Past Medical History:  Diagnosis Date  . COPD (chronic obstructive pulmonary disease) ([place])   . Diabetes mellitus   . Hypertension     Patient Active Problem List   Diagnosis Date Noted  . Syncope 05/05/2018  . HTN (hypertension) 05/05/2018  . DM2 (diabetes mellitus, type 2) ([place]) 05/05/2018  . COPD (chronic obstructive pulmonary disease) ([place]) 05/05/2018    Past Surgical History:  Procedure Laterality Date  . ABDOMINAL HYSTERECTOMY       OB History   None      Home Medications    Prior to Admission medications   Medication Sig Start Date End Date Taking? Authorizing Provider  amoxicillin-clavulanate  (AUGMENTIN) 875-125 MG tablet Take 1 tablet by mouth every 12 (twelve) hours. 05/09/18   Weyman Bogdon, Dellis Filbert, PA-C  ANORO ELLIPTA 62.5-25 MCG/INH AEPB Inhale 1 puff into the lungs daily. 04/13/18   [provider]  aspirin EC 81 MG tablet Take 81 mg by mouth daily.    [provider]  HYDROcodone-acetaminophen (NORCO/VICODIN) 5-325 MG tablet Take 1 tablet by mouth every 4 (four) hours as needed. 05/09/18   Jaycee Pelzer, Dellis Filbert, PA-C  irbesartan (AVAPRO) 300 MG tablet Take 300 mg by mouth daily.    [provider]  metFORMIN (GLUCOPHAGE) 500 MG tablet Take 500 mg by mouth at bedtime.     [provider]  metoprolol succinate (TOPROL-XL) 25 MG 24 hr tablet Take 25 mg by mouth daily. 03/01/18   [provider]  Multiple Vitamin (MULTIVITAMIN WITH MINERALS) TABS tablet Take 1 tablet by mouth daily.    [provider]  pravastatin (PRAVACHOL) 80 MG tablet Take 80 mg by mouth every evening.     [provider]    Family History No family history on file.  Social History Social History   Tobacco Use  . Smoking status: Current Every Day Smoker    Packs/day: 0.25    Types: Cigarettes  . Smokeless tobacco: Never Used  Substance Use Topics  . Alcohol use: No  . Drug use: No     Allergies   Patient has no known allergies.   Review of Systems Review of Systems  All other systems  reviewed and are negative.  Physical Exam Updated Vital Signs BP (!) 158/47   Pulse 68   Temp 98.3 F (36.8 C) (Oral)   Resp 14   SpO2 95%   Physical Exam  Constitutional: She is oriented to person, place, and time. She appears well-developed and well-nourished.  HENT:  Head: Normocephalic and atraumatic.  Firm swelling at the left parotid; no fluctuance - decreased range of motion of the jaw although patient is able to speak without apparent difficulty signs of discomfort-no intraoral swelling-neck is supple full active range of motion nontender, floor of  the mouth is soft  Eyes: Pupils are equal, round, and reactive to light. Conjunctivae are normal. Right eye exhibits no discharge. Left eye exhibits no discharge. No scleral icterus.  Neck: Normal range of motion. No JVD present. No tracheal deviation present.  Pulmonary/Chest: Effort normal. No stridor.  Neurological: She is alert and oriented to person, place, and time. Coordination normal.  Psychiatric: She has a normal mood and affect. Her behavior is normal. Judgment and thought content normal.  Nursing note and vitals reviewed.    ED Treatments / Results  Labs (all labs ordered are listed, but only abnormal results are displayed) Labs Reviewed  CBC WITH DIFFERENTIAL/PLATELET - Abnormal; Notable for the following components:      Result Value   RBC 7.00 (*)    Hemoglobin 17.5 (*)    HCT 56.9 (*)    MCH 25.0 (*)    RDW 16.9 (*)    Platelets 131 (*)    All other components within normal limits  BASIC METABOLIC PANEL - Abnormal; Notable for the following components:   Glucose, Bld 121 (*)    Creatinine, Ser 1.05 (*)    GFR calc non Af Amer 50 (*)    GFR calc Af Amer 57 (*)    All other components within normal limits    EKG None  Radiology Ct Soft Tissue Neck W Contrast  Result Date: 05/09/2018 CLINICAL DATA:  Initial evaluation for left-sided parotid swelling, neoplasm. EXAM: CT NECK WITH CONTRAST TECHNIQUE: Multidetector CT imaging of the neck was performed using the standard protocol following the bolus administration of intravenous contrast. CONTRAST:  33mL OMNIPAQUE IOHEXOL 300 MG/ML  SOLN COMPARISON:  Prior CT from 05/05/2018. FINDINGS: Pharynx and larynx: Oral cavity within normal limits without discrete mass or loculated collection. Patient is edentulous. Palatine tonsils symmetric and within normal limits. Nasopharynx within normal limits. No retropharyngeal collection. Epiglottis normal. Vallecula clear. Remainder of the hypopharynx and supraglottic larynx within  normal limits. True cords symmetric and normal. Subglottic airway patent and clear. Salivary glands: Left parotid gland is asymmetrically enlarged as compared to the right with relative hyperenhancement. Hazy inflammatory stranding within the subcutaneous fat of the adjacent left parotid space, extending inferiorly towards the left masticator and submandibular spaces. Asymmetric thickening along the left platysmas. Mild induration within the adjacent left parapharyngeal space as well. Findings suggestive of acute parotitis. No obstructive radiopaque stones identified. There is a superimposed ovoid cystic lesion position within the superficial lobe of the left parotid measuring 1.5 x 1.4 x 2.1 cm (series 3, image 27). This appears to correspond with hyperdense lesion seen on prior CT. Unclear whether this reflects a small collection or possibly a cystic neoplasm. Several additional well-circumscribed hyperdense intraparotid nodule seen as follows; 12 mm lesion at the superficial lobe (series 3, image 21). 15 mm lesion at the posterior parotid (series 3, image 25). 2.0 x 2.3 x 3.2 cm lesion  at the inferior left parotid (series 3, image 36). Findings are indeterminate. Right parotid gland and submandibular glands otherwise normal in appearance without acute abnormality. Thyroid: Normal. Lymph nodes: No pathologically enlarged lymph nodes identified within the neck. Few small hyperdense left level II a nodes measure up to 8 mm, of uncertain significance. Vascular: Normal intravascular enhancement seen throughout the neck. Atherosclerotic change about the aortic arch and carotid bifurcations bilaterally. Limited intracranial: Unremarkable. Visualized orbits: Unremarkable. Mastoids and visualized paranasal sinuses: Mild mucosal thickening within the ethmoidal air cells and maxillary sinuses. Paranasal sinuses are otherwise largely clear. Visualize mastoids and middle ear cavities are well pneumatized and free of fluid.  Skeleton: No acute osseous abnormality. No discrete lytic or blastic osseous lesions. Moderate degenerative spondylolysis present at C5-6 and C6-7. Upper chest: Visualized upper chest demonstrates no acute finding. Partially visualized lungs are clear. Other: None. IMPRESSION: 1. Asymmetric swelling and enhancement involving the left parotid gland, with associated inflammatory stranding within the adjacent left face. Findings suggestive of acute parotitis. No obstructive stone identified. 2. Multiple superimposed left parotid nodules as detailed above, indeterminate. One of these nodules is cystic in appearance, previously seen to be solid in nature on recent head CT from 05/05/2018. ENT referral and consultation for further workup and possible correlation with histologic sampling suggested. 3. No other acute abnormality identified within the neck. Electronically Signed   By: Jeannine Boga M.D.   On: 05/09/2018 19:42    Procedures Procedures (including critical care time)  Medications Ordered in ED Medications  iohexol (OMNIPAQUE) 300 MG/ML solution 75 mL (75 mLs Intravenous Contrast Given 05/09/18 1840)  amoxicillin-clavulanate (AUGMENTIN) 875-125 MG per tablet 1 tablet (1 tablet Oral Given 05/09/18 2127)     Initial Impression / Assessment and Plan / ED Course  I have reviewed the triage vital signs and the nursing notes.  Pertinent labs & imaging results that were available during my care of the patient were reviewed by me and considered in my medical decision making (see chart for details).     Labs: CBC, BMP  Imaging: CT soft tissue neck  Consults:  Therapeutics: Augmentin  Discharge Meds: Augmentin, hydrocodone  Assessment/Plan:  77 year old female presents today with parotitis.  No complicating features noted other than parotid nodules previously noted and discussed.  Patient well-appearing in no acute distress with no fever elevated white count.  I discussed the case with  attending physician Dr. Ayesha Rumpf who saw the patient at bedside.  Patient would not like to be hospitalized for this, I do find this is reasonable at this time and she will return immediately with any new or worsening signs or symptoms.  Patient will be given antibiotics, encouraged follow-up as an outpatient with her primary care and ENT specialist.  Patient requesting pain medicine for home, she has taken hydrocodone in the past without difficulties, I do find this reasonable in the short-term, she is counseled on safe use of pain medication.  Patient verbalized understanding and agreement to today's plan had no further questions or concerns at time discharge.   Final Clinical Impressions(s) / ED Diagnoses   Final diagnoses:  Parotitis    ED Discharge Orders         Ordered    amoxicillin-clavulanate (AUGMENTIN) 875-125 MG tablet  Every 12 hours     05/09/18 2123    HYDROcodone-acetaminophen (NORCO/VICODIN) 5-325 MG tablet  Every 4 hours PRN     05/09/18 2123           Edel Rivero,  Lurlean Horns 05/09/18 2157    Quintella Reichert, MD 05/10/18 [phone]

## 2018-05-09 NOTE — ED Notes (Signed)
Patient transported to CT 

## 2018-05-10 DIAGNOSIS — K118 Other diseases of salivary glands: Secondary | ICD-10-CM | POA: Insufficient documentation

## 2018-05-20 ENCOUNTER — Ambulatory Visit: Payer: Medicare Other | Admitting: Pulmonary Disease

## 2018-05-20 ENCOUNTER — Encounter: Payer: Self-pay | Admitting: Pulmonary Disease

## 2018-05-20 VITALS — BP 144/86 | HR 76 | Ht 62.5 in | Wt 177.8 lb

## 2018-05-20 DIAGNOSIS — R0609 Other forms of dyspnea: Secondary | ICD-10-CM | POA: Diagnosis not present

## 2018-05-20 DIAGNOSIS — R911 Solitary pulmonary nodule: Secondary | ICD-10-CM | POA: Diagnosis not present

## 2018-05-20 DIAGNOSIS — R0600 Dyspnea, unspecified: Secondary | ICD-10-CM

## 2018-05-20 MED ORDER — ALBUTEROL SULFATE HFA 108 (90 BASE) MCG/ACT IN AERS: 2.0000 | INHALATION_SPRAY | Freq: Four times a day (QID) | RESPIRATORY_TRACT | 2 refills | Status: DC | PRN

## 2018-05-20 NOTE — Progress Notes (Signed)
Synopsis: Referred in 05/2018 for lung nodule  Subjective:   PATIENT ID: Melanie Weaver GENDER: female DOB: 01/02/1941, MRN: [id number]   HPI  Chief Complaint  Patient presents with  . CONSULT    pneumonia in May 2019.  had SOB and weakness then but SOB is minimal now and weakness lingers and was told still has pneumonia    Ms. Melanie Weaver is a 77 year old female with tobacco history who presents as a new patient for suspected COPD and lung nodule.  Recently treated for pneumonia and completed Augmentin course. Last dose completed 3 days ago.  She was told she had COPD and started on Anoro for shortness of breath that worsened with exertion.  Bronchodilator initially provided relief however has not used this for several days and reports complete resolution of shortness of breath and cough.  Her dyspnea previously limited her ability to walk at church but now she is able to walk the hallways without stopping to rest.  She does think she needs to be taking inhalers since she feels better.  Denies wheezing, chronic cough or chronic dyspnea.  She is aware of a lung nodule on chest imaging but believes it has been present for many years however only one CT present in system.  Chart review and care everywhere records were reviewed and summarized as below:  Per care everywhere patient had CT on 01/2018 demonstrating mild bilateral lower lobe bronchiectasis and 10 mm groundglass opacity in right upper lobe.  Recent hospitalization for near syncope thought secondary to orthostatic hypotension from recent anti-hypertensive regimen change.  Denies any other recent episodes of near syncope or loss of consciousness.  Denies dizziness, headaches, changes in vision.  Current smoker Average packs/day:1/2ppd x40 years  I have personally reviewed patient's past medical/family/social history, allergies, current medications.  Past Medical History:  Diagnosis Date  . COPD (chronic obstructive pulmonary  disease) ([place])   . Diabetes mellitus   . Hypertension      Family History  Problem Relation Age of Onset  . CVA Brother      Social History   Occupational History  . Not on file  Tobacco Use  . Smoking status: Current Every Day Smoker    Packs/day: 0.50    Years: 40.00    Pack years: 20.00    Types: Cigarettes  . Smokeless tobacco: Never Used  . Tobacco comment: trying to cut down  Substance and Sexual Activity  . Alcohol use: No  . Drug use: No  . Sexual activity: Not on file    No Known Allergies   Outpatient Medications Prior to Visit  Medication Sig Dispense Refill  . ANORO ELLIPTA 62.5-25 MCG/INH AEPB Inhale 1 puff into the lungs daily.  5  . aspirin EC 81 MG tablet Take 81 mg by mouth daily.    . irbesartan (AVAPRO) 300 MG tablet Take 300 mg by mouth daily.    . metFORMIN (GLUCOPHAGE) 500 MG tablet Take 500 mg by mouth 2 (two) times daily with a meal.     . metoprolol succinate (TOPROL-XL) 25 MG 24 hr tablet Take 25 mg by mouth daily.  1  . Multiple Vitamin (MULTIVITAMIN WITH MINERALS) TABS tablet Take 1 tablet by mouth daily.    . pravastatin (PRAVACHOL) 80 MG tablet Take 80 mg by mouth every evening.     Marland Kitchen HYDROcodone-acetaminophen (NORCO/VICODIN) 5-325 MG tablet Take 1 tablet by mouth every 4 (four) hours as needed. 6 tablet 0  . amoxicillin-clavulanate (AUGMENTIN) 875-125 MG  tablet Take 1 tablet by mouth every 12 (twelve) hours. (Patient not taking: Reported on 05/20/2018) 20 tablet 0   No facility-administered medications prior to visit.     Review of Systems  Constitutional: Positive for malaise/fatigue. Negative for chills, diaphoresis, fever and weight loss.  HENT: Negative for congestion and sore throat.   Eyes: Negative for blurred vision.  Respiratory: Positive for cough. Negative for hemoptysis, sputum production, shortness of breath and wheezing.   Cardiovascular: Negative for chest pain, orthopnea, leg swelling and PND.  Gastrointestinal:  Negative for abdominal pain, heartburn and nausea.  Genitourinary: Negative for frequency.  Musculoskeletal: Negative for myalgias.  Skin: Negative for rash.  Neurological: Negative for dizziness, weakness and headaches.  Endo/Heme/Allergies: Does not bruise/bleed easily.     Objective:  Physical Exam  Constitutional: She is oriented to person, place, and time. She appears well-developed and well-nourished. No distress.  HENT:  Head: Atraumatic.  Nose: Nose normal.  Mouth/Throat: Oropharynx is clear and moist. No oral lesions. No oropharyngeal exudate.  Mild left facial swelling  Eyes: Conjunctivae and EOM are normal. No scleral icterus.  Neck: Normal range of motion. Neck supple. No JVD present. No tracheal deviation present.  Cardiovascular: Normal rate, regular rhythm, normal heart sounds and intact distal pulses. Exam reveals no gallop and no friction rub.  No murmur heard. Pulmonary/Chest: Effort normal and breath sounds normal. No stridor. No respiratory distress. She has no wheezes. She has no rales.  Abdominal: Soft. Bowel sounds are normal. She exhibits no distension. There is no tenderness.  Musculoskeletal: Normal range of motion. She exhibits no edema or deformity.  Neurological: She is alert and oriented to person, place, and time. No cranial nerve deficit or sensory deficit.  Skin: Skin is warm and dry. No rash noted. She is not diaphoretic. No erythema. No pallor.  Psychiatric: She has a normal mood and affect. Her behavior is normal. Judgment and thought content normal.  Vitals reviewed.    Vitals:   05/20/18 1031  BP: (!) 144/86  Pulse: 76  SpO2: 96%  Weight: 177 lb 12.8 oz (80.6 kg)  Height: 5' 2.5" (1.588 m)   SpO2: 96 % O2 Device: None (Room air)  Chest imaging:  CT chest without contrast 01/11/2018  (report only): 1.  Bilateral lower lobe bronchiectasis 2.  10 mm right upper lobe groundglass opacity  PFT: None on file  I have personally reviewed  the above labs, images and tests noted above.    Assessment & Plan:   Discussion: 77 year old female with active tobacco abuse, hypertension, diabetes who presents as a new patient for lung nodule and recent pneumonia.  Currently asymptomatic and not on bronchodilators.  Will evaluate pulmonary function status before reinitiating Anoro.  Okay to take SABA as needed.  Right upper lobe lung nodule --CT Chest without contrast ordered for January  Shortness of breath Improved after outpatient treatment for pneumonia --Hold Anoro until PFTs are completed --START Albuterol inhaler 2 puffs every 4-6 hours as needed for shortness of breath or wheezing --Pulmonary function test ordered prior to your next visit  Orders Placed This Encounter  Procedures  . CT Chest Wo Contrast    Standing Status:   Future    Standing Expiration Date:   07/21/2019    Scheduling Instructions:     Schedule in 2 months (Jan 2019)    Order Specific Question:   Preferred imaging location?    Answer:   [facility]  Specific Question:   Radiology Contrast Protocol - do NOT remove file path    Answer:   [email or web address]  . Pulmonary function test    Standing Status:   Future    Standing Expiration Date:   05/21/2019    Scheduling Instructions:     Schedule prior to next visit at patient's convenience - ROV 2 months    Order Specific Question:   Where should this test be performed?    Answer:   [facility]    Order Specific Question:   Full PFT: includes the following: basic spirometry, spirometry pre & post bronchodilator, diffusion capacity (DLCO), lung volumes    Answer:   Full PFT   Meds ordered this encounter  Medications  . albuterol (PROVENTIL HFA;VENTOLIN HFA) 108 (90 Base) MCG/ACT inhaler    Sig: Inhale 2 puffs into the lungs every 6 (six) hours as needed for wheezing or shortness of breath.    Dispense:  1 Inhaler    Refill:  2    Return in about 2  months (around 07/20/2018).   Thank you for choosing [facility] for your health needs!   Renso Swett Rodman Pickle, MD [facility] 05/20/2018 11:00 AM  Personal pager: [phone] If unanswered, please page CCM On-call: [phone]

## 2018-05-20 NOTE — Patient Instructions (Signed)
Right upper lobe lung nodule --CT Chest without contrast ordered for January  Shortness of breath Improved after treatment for pneumonia --Discontinue Anoro  --START Albuterol inhaler 2 puffs every 4-6 hours as needed for shortness of breath or wheezing --Pulmonary function test ordered prior to your next visit  Follow-up with me in 2 months

## 2018-07-20 ENCOUNTER — Inpatient Hospital Stay: Admission: RE | Admit: 2018-07-20 | Payer: Medicare Other | Source: Ambulatory Visit

## 2018-07-21 ENCOUNTER — Ambulatory Visit (INDEPENDENT_AMBULATORY_CARE_PROVIDER_SITE_OTHER): Payer: Medicare Other | Admitting: Nurse Practitioner

## 2018-07-21 ENCOUNTER — Ambulatory Visit: Payer: Medicare Other | Admitting: Pulmonary Disease

## 2018-07-21 ENCOUNTER — Encounter: Payer: Self-pay | Admitting: Nurse Practitioner

## 2018-07-21 DIAGNOSIS — R0609 Other forms of dyspnea: Secondary | ICD-10-CM | POA: Diagnosis not present

## 2018-07-21 DIAGNOSIS — R0600 Dyspnea, unspecified: Secondary | ICD-10-CM

## 2018-07-21 DIAGNOSIS — J449 Chronic obstructive pulmonary disease, unspecified: Secondary | ICD-10-CM

## 2018-07-21 LAB — PULMONARY FUNCTION TEST
DL/VA % pred: 92 %
DL/VA: 4.26 ml/min/mmHg/L
DLCO UNC % PRED: 60 %
DLCO unc: 13.48 ml/min/mmHg
FEF 25-75 PRE: 1.17 L/s
FEF 25-75 Post: 1.44 L/sec
FEF2575-%Change-Post: 22 %
FEF2575-%Pred-Post: 111 %
FEF2575-%Pred-Pre: 90 %
FEV1-%CHANGE-POST: 7 %
FEV1-%Pred-Post: 80 %
FEV1-%Pred-Pre: 75 %
FEV1-PRE: 1.11 L
FEV1-Post: 1.2 L
FEV1FVC-%CHANGE-POST: 0 %
FEV1FVC-%Pred-Pre: 109 %
FEV6-%CHANGE-POST: 7 %
FEV6-%PRED-POST: 77 %
FEV6-%PRED-PRE: 72 %
FEV6-PRE: 1.34 L
FEV6-Post: 1.43 L
FEV6FVC-%Change-Post: 0 %
FEV6FVC-%PRED-PRE: 104 %
FEV6FVC-%Pred-Post: 104 %
FVC-%CHANGE-POST: 6 %
FVC-%Pred-Post: 74 %
FVC-%Pred-Pre: 69 %
FVC-Post: 1.43 L
FVC-Pre: 1.34 L
POST FEV1/FVC RATIO: 84 %
PRE FEV6/FVC RATIO: 100 %
Post FEV6/FVC ratio: 100 %
Pre FEV1/FVC ratio: 83 %

## 2018-07-21 NOTE — Progress Notes (Signed)
@Patient  ID: Acquanetta Chain, female    DOB: [birth year], 78 y.o.   MRN: [id number]  Chief Complaint  Patient presents with  . Results    PFT    Referring provider: Katherina Mires, MD  HPI  78 year old female current smoker with history of lung nodule followed by Dr. Loanne Drilling.  Tests: CXR 05/05/18>>No active cardiopulmonary disease  PFT Results Latest Ref Rng & Units 07/21/2018  FVC-Pre L 1.34  FVC-Predicted Pre % 69  FVC-Post L 1.43  FVC-Predicted Post % 74  Pre FEV1/FVC % % 83  Post FEV1/FCV % % 84  FEV1-Pre L 1.11  FEV1-Predicted Pre % 75  FEV1-Post L 1.20  DLCO UNC% % 60  DLCO COR %Predicted % 92     OV 07/22/18 - Follow up/discuss  Patient presents for a follow-up after having a PFT today.  She states that overall she has been doing well.  She states that she has not been taking her Anoro.  Was discontinued at last visit with Dr. Loanne Drilling on 05/20/2018.  She has been using her albuterol inhaler as needed, but states that she has not needed it often.  She denies any shortness of breath, chest pain, fever, or edema.  Patient does admit that she is still smoking.  She is trying to cut back.  No stress during the holidays but hopes that maybe she can try to quit now.  Note: Patient was walked in the office today and her sats remained at 97% the entire walk on room air.    Allergies  Allergen Reactions  . Amoxicillin Diarrhea    severe  . Maxzide [Hydrochlorothiazide W-Triamterene]     Dizziness    Immunization History  Administered Date(s) Administered  . Pneumococcal Conjugate-13 04/06/2017, 10/18/2017  . Pneumococcal Polysaccharide-23 05/10/2018    Past Medical History:  Diagnosis Date  . COPD (chronic obstructive pulmonary disease) ([place])   . Diabetes mellitus   . Hypertension     Tobacco History: Social History   Tobacco Use  Smoking Status Current Every Day Smoker  . Packs/day: 0.50  . Years: 40.00  . Pack years: 20.00  . Types: Cigarettes    Smokeless Tobacco Never Used  Tobacco Comment   trying to cut down   Ready to quit: Not Answered Counseling given: Not Answered Comment: trying to cut down   Outpatient Encounter Medications as of 07/21/2018  Medication Sig  . albuterol (PROVENTIL HFA;VENTOLIN HFA) 108 (90 Base) MCG/ACT inhaler Inhale 2 puffs into the lungs every 6 (six) hours as needed for wheezing or shortness of breath.  Marland Kitchen aspirin EC 81 MG tablet Take 81 mg by mouth daily.  . irbesartan (AVAPRO) 300 MG tablet Take 300 mg by mouth daily.  . metFORMIN (GLUCOPHAGE) 500 MG tablet Take 500 mg by mouth 2 (two) times daily with a meal.   . metoprolol succinate (TOPROL-XL) 25 MG 24 hr tablet Take 25 mg by mouth daily.  . Multiple Vitamin (MULTIVITAMIN WITH MINERALS) TABS tablet Take 1 tablet by mouth daily.  . pravastatin (PRAVACHOL) 80 MG tablet Take 80 mg by mouth every evening.   . [DISCONTINUED] ANORO ELLIPTA 62.5-25 MCG/INH AEPB Inhale 1 puff into the lungs daily.  . [DISCONTINUED] amoxicillin-clavulanate (AUGMENTIN) 875-125 MG tablet Take 1 tablet by mouth every 12 (twelve) hours. (Patient not taking: Reported on 07/21/2018)   No facility-administered encounter medications on file as of 07/21/2018.      Review of Systems  Review of Systems  Constitutional: Negative.  Negative for  chills and fever.  HENT: Negative.   Respiratory: Negative for cough, shortness of breath and wheezing.   Cardiovascular: Negative.  Negative for chest pain, palpitations and leg swelling.  Gastrointestinal: Negative.   Allergic/Immunologic: Negative.   Neurological: Negative.   Psychiatric/Behavioral: Negative.        Physical Exam  BP 140/84 (BP Location: Right Arm, Patient Position: Sitting, Cuff Size: Normal)   Pulse 74   Ht 5' 2.5" (1.588 m)   Wt 171 lb (77.6 kg)   SpO2 98%   BMI 30.78 kg/m   Wt Readings from Last 5 Encounters:  07/21/18 171 lb (77.6 kg)  05/20/18 177 lb 12.8 oz (80.6 kg)  05/05/18 166 lb 11.2 oz  (75.6 kg)  04/11/13 165 lb (74.8 kg)  04/11/13 165 lb (74.8 kg)     Physical Exam Vitals signs and nursing note reviewed.  Constitutional:      General: She is not in acute distress.    Appearance: She is well-developed.  Cardiovascular:     Rate and Rhythm: Normal rate and regular rhythm.  Pulmonary:     Effort: Pulmonary effort is normal. No respiratory distress.     Breath sounds: Normal breath sounds. No wheezing or rhonchi.  Musculoskeletal:        General: No swelling.  Neurological:     Mental Status: She is alert and oriented to person, place, and time.       Assessment & Plan:   COPD (chronic obstructive pulmonary disease) ([place]) Patient has a CT chest already ordered by Dr. Loanne Drilling.   Patient Instructions  Discussed PFT results Patient needs to follow up with CT chest ordered by Dr. Loanne Drilling Continue Proventil as needed Patient walked in office today sats remained at 97%  Stay active Please quit smoking Follow up with Dr. Loanne Drilling in 2 months or sooner if needed       Fenton Foy, NP 07/22/2018

## 2018-07-21 NOTE — Progress Notes (Signed)
PFT done today. 

## 2018-07-21 NOTE — Patient Instructions (Addendum)
Discussed PFT results Patient needs to follow up with CT chest ordered by Dr. Loanne Drilling Continue Proventil as needed Patient walked in office today sats remained at 97%  Stay active Please quit smoking Follow up with Dr. Loanne Drilling in 2 months or sooner if needed

## 2018-07-22 ENCOUNTER — Encounter: Payer: Self-pay | Admitting: Nurse Practitioner

## 2018-07-22 NOTE — Assessment & Plan Note (Signed)
Patient has a CT chest already ordered by Dr. Loanne Drilling.   Patient Instructions  Discussed PFT results Patient needs to follow up with CT chest ordered by Dr. Loanne Drilling Continue Proventil as needed Patient walked in office today sats remained at 97%  Stay active Please quit smoking Follow up with Dr. Loanne Drilling in 2 months or sooner if needed

## 2018-08-03 ENCOUNTER — Ambulatory Visit (INDEPENDENT_AMBULATORY_CARE_PROVIDER_SITE_OTHER)
Admission: RE | Admit: 2018-08-03 | Discharge: 2018-08-03 | Disposition: A | Discharge status: Home / Self Care | Payer: Medicare Other | Source: Ambulatory Visit | Attending: Pulmonary Disease | Admitting: Pulmonary Disease

## 2018-08-03 DIAGNOSIS — R911 Solitary pulmonary nodule: Secondary | ICD-10-CM

## 2018-08-11 ENCOUNTER — Encounter: Payer: Self-pay | Admitting: Family Medicine

## 2018-08-11 ENCOUNTER — Ambulatory Visit (INDEPENDENT_AMBULATORY_CARE_PROVIDER_SITE_OTHER): Payer: Medicare Other | Admitting: Family Medicine

## 2018-08-11 VITALS — BP 189/69 | HR 75 | Resp 18 | Ht 62.0 in | Wt 169.0 lb

## 2018-08-11 DIAGNOSIS — E1165 Type 2 diabetes mellitus with hyperglycemia: Secondary | ICD-10-CM

## 2018-08-11 DIAGNOSIS — I10 Essential (primary) hypertension: Secondary | ICD-10-CM

## 2018-08-11 DIAGNOSIS — I517 Cardiomegaly: Secondary | ICD-10-CM

## 2018-08-11 DIAGNOSIS — F17210 Nicotine dependence, cigarettes, uncomplicated: Secondary | ICD-10-CM

## 2018-08-11 DIAGNOSIS — J449 Chronic obstructive pulmonary disease, unspecified: Secondary | ICD-10-CM | POA: Diagnosis not present

## 2018-08-11 DIAGNOSIS — Z7689 Persons encountering health services in other specified circumstances: Secondary | ICD-10-CM

## 2018-08-11 DIAGNOSIS — E119 Type 2 diabetes mellitus without complications: Secondary | ICD-10-CM

## 2018-08-11 LAB — GLUCOSE, POCT (MANUAL RESULT ENTRY): POC Glucose: 190 mg/dl — AB (ref 70–99)

## 2018-08-11 MED ORDER — METOPROLOL SUCCINATE ER 50 MG PO TB24: 50.0000 mg | ORAL_TABLET | Freq: Every day | ORAL | 2 refills | Status: DC

## 2018-08-11 NOTE — Progress Notes (Signed)
Melanie Weaver, is a 78 y.o. female  NWG:[id number]  [id number]:[id number]  DOB - [birth year]  CC:  Chief Complaint  Patient presents with  . Establish Care  . Diabetes  . Hypertension  . Hyperlipidemia       HPI: Melanie Weaver is a 78 y.o. female is here today to establish care.   Melanie Weaver has Syncope; HTN (hypertension); DM2 (diabetes mellitus, type 2) ([place]); and COPD (chronic obstructive pulmonary disease) ([place]) on their problem list.    Patient here today to establish care. Patient is a former patient of Dr. Doreene Nest at [facility].  She suffers from multiple chronic conditions and majority of the history today has been obtained from care everywhere as patient is a very poor historian regarding current medications and chronic conditions.     Hypertension/CHF Patient's suffers from hypertension for several years.  According to last notes from primary care blood pressure readings have ran high over the course of patient's last few visits.  Patient's blood pressure is high today at 189/60 2 readings.  She admits to being anxious regarding obtaining a new primary care provider. Most recent blood pressure reading at pulmonology office was 140/84.  She reports in generally this is where her blood pressure has been during office visits.  She is currently managed on ibesartan 300 mg tablets and metoprolol 25 mg extended release.  Patient endorses shortness of breath although denies chest pain.  Patient had been referred to cardiologist office through [place] however reports that she was unable to find the office so therefore never followed up with cardiology.  According to Dr. Rayetta Pigg previous notes patient was to receive an echocardiogram.  Patient has a history of left ventricle hypertrophy has not had any updated studies performed.  No home monitoring of blood pressure at home.  Endorses reduce sodium intake.  No routine physical exercise.  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  She admits to difficulty remembering to take medications as she has to keep track of her son's medications as well. She had difficulty identifying which medication was used to treat which condition.  Type 2 diabetes She also suffers from type 2 diabetes which her recent has been well controlled.  Her last A1c was completed in December which was 6.4.  She is managed only on metformin 500 mg.  She was able to report blood sugar readings today ranging on average from 110s to 130s with the lowest readings being in the 70s and 80s.  She checks her blood sugar at least once a day.  He is current on both pneumococcal 23 and Prevnar 13.  She is on an ACE inhibitor.  She is currently on a statin.  She had a most recent eye exam this year and reports no evidence of diabetic retinopathy. Denies any visual problems, urinary frequency, increased thirst, or changes in appetite.  Current medications: Current Outpatient Medications:  .  aspirin EC 81 MG tablet, Take 81 mg by mouth daily., Disp: , Rfl:  .  irbesartan (AVAPRO) 300 MG tablet, Take 300 mg by mouth daily., Disp: , Rfl:  .  metFORMIN (GLUCOPHAGE) 500 MG tablet, Take 500 mg by mouth 2 (two) times daily with a meal. , Disp: , Rfl:  .  metoprolol succinate (TOPROL-XL) 25 MG 24 hr tablet, Take 25 mg by mouth daily., Disp: , Rfl: 1 .  Multiple Vitamin (MULTIVITAMIN WITH MINERALS) TABS  tablet, Take 1 tablet by mouth daily., Disp: , Rfl:  .  pravastatin (PRAVACHOL) 80 MG tablet, Take 80 mg by mouth every evening. , Disp: , Rfl:    Pertinent family medical history: family history includes CVA in her brother; Diabetes in her daughter and son; Hypertension in her son; Mental retardation in her son; Stroke in her son.   Allergies  Allergen Reactions  . Amoxicillin Diarrhea    severe  . Maxzide [Hydrochlorothiazide W-Triamterene]     Dizziness    Social History   Socioeconomic  History  . Marital status: Married    Spouse name: Not on file  . Number of children: Not on file  . Years of education: Not on file  . Highest education level: Not on file  Occupational History  . Not on file  Social Needs  . Financial resource strain: Not on file  . Food insecurity:    Worry: Not on file    Inability: Not on file  . Transportation needs:    Medical: Not on file    Non-medical: Not on file  Tobacco Use  . Smoking status: Current Every Day Smoker    Packs/day: 0.50    Years: 40.00    Pack years: 20.00    Types: Cigarettes  . Smokeless tobacco: Never Used  . Tobacco comment: trying to cut down  Substance and Sexual Activity  . Alcohol use: No  . Drug use: No  . Sexual activity: Not on file  Lifestyle  . Physical activity:    Days per week: Not on file    Minutes per session: Not on file  . Stress: Not on file  Relationships  . Social connections:    Talks on phone: Not on file    Gets together: Not on file    Attends religious service: Not on file    Active member of club or organization: Not on file    Attends meetings of clubs or organizations: Not on file    Relationship status: Not on file  . Intimate partner violence:    Fear of current or ex partner: Not on file    Emotionally abused: Not on file    Physically abused: Not on file    Forced sexual activity: Not on file  Other Topics Concern  . Not on file  Social History Narrative  . Not on file    Review of Systems: Pertinent negatives listed in HPI  Objective:   Vitals:   08/11/18 1004  BP: (!) 189/69  Pulse: 75  Resp: 18  SpO2: 94%    BP Readings from Last 3 Encounters:  08/11/18 (!) 189/69  07/21/18 140/84  05/20/18 (!) 144/86    Filed Weights   08/11/18 1004  Weight: 169 lb (76.7 kg)      Physical Exam: Constitutional: Patient appears well-developed and well-nourished. No distress. HENT: Normocephalic, atraumatic, External right and left ear normal. Oropharynx is  clear and moist.  Eyes: Conjunctivae and EOM are normal. PERRLA, no scleral icterus. Neck: Normal ROM. Neck supple. No JVD. No tracheal deviation. No thyromegaly. CVS: RRR, S1/S2 +, no murmurs, no gallops, no carotid bruit.  Pulmonary: Effort and breath sounds normal, no stridor, rhonchi, wheezes, rales.  Abdominal: Soft. BS +, no distension, tenderness, rebound or guarding.  Musculoskeletal: Normal range of motion. No edema and no tenderness.  Neuro: Alert. Normal muscle tone coordination. Normal gait. BUE and BLE strength 5/5. Bilateral hand grips symmetrical. Skin: Skin is warm and dry.  No rash noted. Not diaphoretic. No erythema. No pallor. Psychiatric: Normal mood and affect. Behavior, judgment, thought content normal.  Lab Results (prior encounters)  Lab Results  Component Value Date   WBC 7.5 05/09/2018   HGB 17.5 (H) 05/09/2018   HCT 56.9 (H) 05/09/2018   MCV 81.3 05/09/2018   PLT 131 (L) 05/09/2018   Lab Results  Component Value Date   CREATININE 1.05 (H) 05/09/2018   BUN 12 05/09/2018   NA 138 05/09/2018   K 3.9 05/09/2018   CL 100 05/09/2018   CO2 28 05/09/2018    No results found for: HGBA1C  No results found for: CHOL, TRIG, HDL, CHOLHDL, VLDL, LDLCALC      Assessment and plan:  1. Encounter to establish care  2. Type 2 diabetes mellitus without complication, without long-term current use of insulin (HCC) Recent A1c 6.4 as of December 2019.  No changes to current medication regimen - Glucose (CBG), 190 non-fasting Continue metformin at current dose no changes today.   3. Accelerated hypertension In review of previous EMR notes from previous PCP patient was scheduled to have a echocardiogram and follow-up with cardiology a little over a month ago although she never attended the appointment as she could not find the office.  I am referring patient to cardiology here at heart care here in [place].  She verbalized that she will be able to access their office as  it is locally here in [place]. Blood pressure is accelerated today will increase metoprolol from 25 mg once a day to 50 mg once a day.  Continue Irbesartan at current dose.  - Ambulatory referral to Cardiology  4. LVH (left ventricular hypertrophy) -Patient was recommended to follow-up with cardiology and obtain an echocardiogram however this is not been done.  Will refer patient to heart care in [facility] for follow-up. - Ambulatory referral to Cardiology  5. Chronic obstructive pulmonary disease, unspecified COPD type ([place]) -Currently asymptomatic and followed by pulmonology at [facility].   Return in about 6 weeks (around 09/22/2018), hypertension and cognitive state. Will also complete a MMSE at follow-up given time constraints was unable to complete during today's visit.   The patient was given clear instructions to go to ER or return to medical center if symptoms don't improve, worsen or new problems develop. The patient verbalized understanding. The patient was advised  to call and obtain lab results if they haven't heard anything from out office within 7-10 business days.  Molli Barrows, FNP Primary Care at [facility] [address] [place] [address] [phone]: [phone]    This note has been created with Dragon speech recognition software and Engineer, materials. Any transcriptional errors are unintentional.

## 2018-08-11 NOTE — Patient Instructions (Addendum)
Thank you for choosing Primary Care at [facility] to be your medical home!    Melanie Weaver was seen by Molli Barrows, FNP today.   Darden Amber primary care provider is Scot Jun, FNP.   For the best care possible, you should try to see Molli Barrows, FNP-C whenever you come to the clinic.   We look forward to seeing you again soon!  If you have any questions about your visit today, please call us at [phone] or feel free to reach your primary care provider via [facility].      Hypertension Hypertension, commonly called high blood pressure, is when the force of blood pumping through the arteries is too strong. The arteries are the blood vessels that carry blood from the heart throughout the body. Hypertension forces the heart to work harder to pump blood and may cause arteries to become narrow or stiff. Having untreated or uncontrolled hypertension can cause heart attacks, strokes, kidney disease, and other problems. A blood pressure reading consists of a higher number over a lower number. Ideally, your blood pressure should be below 120/80. The first ("top") number is called the systolic pressure. It is a measure of the pressure in your arteries as your heart beats. The second ("bottom") number is called the diastolic pressure. It is a measure of the pressure in your arteries as the heart relaxes. What are the causes? The cause of this condition is not known. What increases the risk? Some risk factors for high blood pressure are under your control. Others are not. Factors you can change  Smoking.  Having type 2 diabetes mellitus, high cholesterol, or both.  Not getting enough exercise or physical activity.  Being overweight.  Having too much fat, sugar, calories, or salt (sodium) in your diet.  Drinking too much alcohol. Factors that are difficult or impossible to change  Having chronic kidney disease.  Having a family history of high blood  pressure.  Age. Risk increases with age.  Race. You may be at higher risk if you are African-American.  Gender. Men are at higher risk than women before age 68. After age 57, women are at higher risk than men.  Having obstructive sleep apnea.  Stress. What are the signs or symptoms? Extremely high blood pressure (hypertensive crisis) may cause:  Headache.  Anxiety.  Shortness of breath.  Nosebleed.  Nausea and vomiting.  Severe chest pain.  Jerky movements you cannot control (seizures). How is this diagnosed? This condition is diagnosed by measuring your blood pressure while you are seated, with your arm resting on a surface. The cuff of the blood pressure monitor will be placed directly against the skin of your upper arm at the level of your heart. It should be measured at least twice using the same arm. Certain conditions can cause a difference in blood pressure between your right and left arms. Certain factors can cause blood pressure readings to be lower or higher than normal (elevated) for a short period of time:  When your blood pressure is higher when you are in a health care provider's office than when you are at home, this is called white coat hypertension. Most people with this condition do not need medicines.  When your blood pressure is higher at home than when you are in a health care provider's office, this is called masked hypertension. Most people with this condition may need medicines to control blood pressure. If you have a high blood pressure reading during one visit or  you have normal blood pressure with other risk factors:  You may be asked to return on a different day to have your blood pressure checked again.  You may be asked to monitor your blood pressure at home for 1 week or longer. If you are diagnosed with hypertension, you may have other blood or imaging tests to help your health care provider understand your overall risk for other conditions. How  is this treated? This condition is treated by making healthy lifestyle changes, such as eating healthy foods, exercising more, and reducing your alcohol intake. Your health care provider may prescribe medicine if lifestyle changes are not enough to get your blood pressure under control, and if:  Your systolic blood pressure is above 130.  Your diastolic blood pressure is above 80. Your personal target blood pressure may vary depending on your medical conditions, your age, and other factors. Follow these instructions at home: Eating and drinking   Eat a diet that is high in fiber and potassium, and low in sodium, added sugar, and fat. An example eating plan is called the DASH (Dietary Approaches to Stop Hypertension) diet. To eat this way: ? Eat plenty of fresh fruits and vegetables. Try to fill half of your plate at each meal with fruits and vegetables. ? Eat whole grains, such as whole wheat pasta, brown rice, or whole grain bread. Fill about one quarter of your plate with whole grains. ? Eat or drink low-fat dairy products, such as skim milk or low-fat yogurt. ? Avoid fatty cuts of meat, processed or cured meats, and poultry with skin. Fill about one quarter of your plate with lean proteins, such as fish, chicken without skin, beans, eggs, and tofu. ? Avoid premade and processed foods. These tend to be higher in sodium, added sugar, and fat.  Reduce your daily sodium intake. Most people with hypertension should eat less than 1,500 mg of sodium a day.  Limit alcohol intake to no more than 1 drink a day for nonpregnant women and 2 drinks a day for men. One drink equals 12 oz of beer, 5 oz of wine, or 1 oz of hard liquor. Lifestyle   Work with your health care provider to maintain a healthy body weight or to lose weight. Ask what an ideal weight is for you.  Get at least 30 minutes of exercise that causes your heart to beat faster (aerobic exercise) most days of the week. Activities may  include walking, swimming, or biking.  Include exercise to strengthen your muscles (resistance exercise), such as pilates or lifting weights, as part of your weekly exercise routine. Try to do these types of exercises for 30 minutes at least 3 days a week.  Do not use any products that contain nicotine or tobacco, such as cigarettes and e-cigarettes. If you need help quitting, ask your health care provider.  Monitor your blood pressure at home as told by your health care provider.  Keep all follow-up visits as told by your health care provider. This is important. Medicines  Take over-the-counter and prescription medicines only as told by your health care provider. Follow directions carefully. Blood pressure medicines must be taken as prescribed.  Do not skip doses of blood pressure medicine. Doing this puts you at risk for problems and can make the medicine less effective.  Ask your health care provider about side effects or reactions to medicines that you should watch for. Contact a health care provider if:  You think you are having a  reaction to a medicine you are taking.  You have headaches that keep coming back (recurring).  You feel dizzy.  You have swelling in your ankles.  You have trouble with your vision. Get help right away if:  You develop a severe headache or confusion.  You have unusual weakness or numbness.  You feel faint.  You have severe pain in your chest or abdomen.  You vomit repeatedly.  You have trouble breathing. Summary  Hypertension is when the force of blood pumping through your arteries is too strong. If this condition is not controlled, it may put you at risk for serious complications.  Your personal target blood pressure may vary depending on your medical conditions, your age, and other factors. For most people, a normal blood pressure is less than 120/80.  Hypertension is treated with lifestyle changes, medicines, or a combination of both.  Lifestyle changes include weight loss, eating a healthy, low-sodium diet, exercising more, and limiting alcohol. This information is not intended to replace advice given to you by your health care provider. Make sure you discuss any questions you have with your health care provider. Document Released: 06/22/2005 Document Revised: 05/20/2016 Document Reviewed: 05/20/2016 Elsevier Interactive Patient Education  2019 [employer].

## 2018-08-12 ENCOUNTER — Telehealth: Payer: Self-pay | Admitting: Pulmonary Disease

## 2018-08-12 NOTE — Progress Notes (Signed)
Reviewed with patient on telephone encounter 08/12/18.

## 2018-08-12 NOTE — Telephone Encounter (Signed)
I called patient regarding CT Chest results.  No evidence of right GGO noted on previous CT on 01/2018. CT Chest does show RML and LLL solid pulmonary nodules, measured 94mm and 106mm respectively. Discussed the low likelihood that these lesions represent malignancy. Recommended repeat CT in one year from last scan. If patient develops new respiratory symptoms (worsening dyspnea, hemoptysis, weight loss), we could reconsider repeat imaging earlier however would be reasonable to wait at this time. Patient expressed understanding and agreed to plan.  Rodman Pickle, M.D. [facility] Pulmonary/Critical Care Medicine Pager: [phone] After hours pager: [phone]

## 2018-08-14 NOTE — Progress Notes (Signed)
Cardiology Office Note   Date:  08/16/2018   ID:  Melanie Weaver, DOB [birth year], MRN [id number]  PCP:  Scot Jun, FNP  Cardiologist:   No primary care provider on file. Referring:  Scot Jun, FNP  Chief Complaint  Patient presents with  . Abnormal Echo      History of Present Illness: Melanie Weaver is a 78 y.o. female who is referred by Scot Jun, FNP for evaluation of HTN and LVH.   She had syncope on 10/31.  I reviewed hospital records.  It was thought that she was probably dehydrated at admission.  She had some mild renal insufficiency.  She was taken off of triamterene hydrochlorothiazide on discharge.  I do note that during that admission she was noted to have an echocardiogram with severe concentric left ventricular hypertrophy with an ejection fraction of 55%.  She does not report having uncontrolled hypertension although her blood pressure is slightly elevated.  She does take medications for blood pressure.  She is not had any other cardiac work-up.  She denies any shortness of breath, PND or orthopnea.  She was short of breath when she had a pneumonia earlier this year.  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  She says she is had a stressful life with her husband who died about 10 years ago.  She is cared for other family members including her mom who is an alcoholic.  She denies any palpitations.  She is had no further syncopal episodes.  She does not describe chest pressure, neck or arm discomfort.  She is had no weight gain or edema.    Past Medical History:  Diagnosis Date  . Essential hypertension   . Mixed hyperlipidemia   . Parotid mass   . Pulmonary nodule   . Type 2 diabetes mellitus without complication, without long-term current use of insulin [facility])     Past Surgical History:  Procedure Laterality Date  . ABDOMINAL HYSTERECTOMY       Current Outpatient Medications  Medication Sig Dispense Refill  . aspirin EC 81 MG  tablet Take 81 mg by mouth daily.    . irbesartan (AVAPRO) 300 MG tablet Take 300 mg by mouth daily.    . metFORMIN (GLUCOPHAGE) 500 MG tablet Take 500 mg by mouth 2 (two) times daily with a meal.     . metoprolol succinate (TOPROL-XL) 50 MG 24 hr tablet Take 1 tablet (50 mg total) by mouth daily. 30 tablet 2  . Multiple Vitamin (MULTIVITAMIN WITH MINERALS) TABS tablet Take 1 tablet by mouth daily.    . pravastatin (PRAVACHOL) 80 MG tablet Take 80 mg by mouth every evening.      No current facility-administered medications for this visit.     Allergies:   Amoxicillin and Maxzide [hydrochlorothiazide w-triamterene]    Social History:  The patient  reports that she has been smoking cigarettes. She has a 20.00 pack-year smoking history. She has never used smokeless tobacco. She reports that she does not drink alcohol or use drugs.   Family History:  The patient's family history includes CVA in her brother; Diabetes in her daughter and son; Hypertension in her son; Mental retardation in her son; Stroke in her son.  Of note she is not clear of her father's history.   ROS:  Please see the history of present illness.   Otherwise, review of systems are positive for none.   All other systems are reviewed and negative.  PHYSICAL EXAM: VS:  BP (!) 148/76   Pulse 66   Ht 5\' 2"  (1.575 m)   Wt 169 lb 12.8 oz (77 kg)   SpO2 97%   BMI 31.06 kg/m  , BMI Body mass index is 31.06 kg/m. GENERAL:  Well appearing HEENT:  Pupils equal round and reactive, fundi not visualized, oral mucosa unremarkable NECK:  No jugular venous distention, waveform within normal limits, carotid upstroke brisk and symmetric, no bruits, no thyromegaly LYMPHATICS:  No cervical, inguinal adenopathy LUNGS:  Clear to auscultation bilaterally BACK:  No CVA tenderness CHEST:  Unremarkable HEART:  PMI not displaced or sustained,S1 and S2 within normal limits, no S3, no S4, no clicks, no rubs, no murmurs ABD:  Flat, positive  bowel sounds normal in frequency in pitch, no bruits, no rebound, no guarding, no midline pulsatile mass, no hepatomegaly, no splenomegaly EXT:  2 plus pulses throughout, no edema, no cyanosis no clubbing SKIN:  No rashes no nodules NEURO:  Cranial nerves II through XII grossly intact, motor grossly intact throughout PSYCH:  Cognitively intact, oriented to person place and time    EKG:  EKG is not ordered today. The ekg ordered 05/05/18 demonstrates sinus rhythm, rate 77, premature atrial contractions, left axis deviation, probable left atrial enlargement, poor anterior R wave progression.   Recent Labs: 05/06/2018: Magnesium 1.8 05/09/2018: BUN 12; Creatinine, Ser 1.05; Hemoglobin 17.5; Platelets 131; Potassium 3.9; Sodium 138    Lipid Panel No results found for: CHOL, TRIG, HDL, CHOLHDL, VLDL, LDLCALC, LDLDIRECT    Wt Readings from Last 3 Encounters:  08/16/18 169 lb 12.8 oz (77 kg)  08/11/18 169 lb (76.7 kg)  07/21/18 171 lb (77.6 kg)      Other studies Reviewed: Additional studies/ records that were reviewed today include: Hospital records. Review of the above records demonstrates:  Please see elsewhere in the note.     ASSESSMENT AND PLAN:  HTN:   Her blood pressure is slightly elevated.  She is getting keep a blood pressure diary and I will make changes based on these results.  LVH: The patient seems to have severe LVH out of proportion to her blood pressures.  I am going to order a PYP scan.     Current medicines are reviewed at length with the patient today.  The patient does not have concerns regarding medicines.  The following changes have been made:  no change  Labs/ tests ordered today include:   Orders Placed This Encounter  Procedures  . MYOCARDIAL AMYLOID IMAGING PLANAR & SPECT     Disposition:   FU with me 1 month.     Signed, Minus Breeding, MD  08/16/2018 3:38 PM    [place]

## 2018-08-15 ENCOUNTER — Encounter: Payer: Self-pay | Admitting: Family Medicine

## 2018-08-16 ENCOUNTER — Encounter: Payer: Self-pay | Admitting: Cardiology

## 2018-08-16 ENCOUNTER — Ambulatory Visit: Payer: Medicare Other | Admitting: Cardiology

## 2018-08-16 VITALS — BP 148/76 | HR 66 | Ht 62.0 in | Wt 169.8 lb

## 2018-08-16 DIAGNOSIS — I517 Cardiomegaly: Secondary | ICD-10-CM | POA: Diagnosis not present

## 2018-08-16 DIAGNOSIS — I10 Essential (primary) hypertension: Secondary | ICD-10-CM | POA: Diagnosis not present

## 2018-08-16 NOTE — Patient Instructions (Signed)
Medication Instructions:  Continue current medicatons  If you need a refill on your cardiac medications before your next appointment, please call your pharmacy.  Labwork: None Ordered   Take the provided lab slips with you to the lab for your blood draw.   When you have your labs (blood work) drawn today and your tests are completely normal, you will receive your results only by MyChart Message (if you have MyChart) -OR-  A paper copy in the mail.  If you have any lab test that is abnormal or we need to change your treatment, we will call you to review these results.  Testing/Procedures: Your physician has requested that you have a  Amyloid study. For further information please visit [email or web address]. Please follow instruction sheet, as given.   Follow-Up: . Your physician recommends that you schedule a follow-up appointment in: 1 Month   At [facility], you and your health needs are our priority.  As part of our continuing mission to provide you with exceptional heart care, we have created designated Provider Care Teams.  These Care Teams include your primary Cardiologist (physician) and Advanced Practice Providers (APPs -  Physician Assistants and Nurse Practitioners) who all work together to provide you with the care you need, when you need it.  Thank you for choosing [facility] at [facility]!!

## 2018-08-22 ENCOUNTER — Encounter: Payer: Self-pay | Admitting: Cardiology

## 2018-08-22 ENCOUNTER — Telehealth: Payer: Self-pay | Admitting: Family Medicine

## 2018-08-22 NOTE — Telephone Encounter (Signed)
Patient called requesting refills of irbesartan (AVAPRO) 300 MG tablet [[id number] and metFORMIN (GLUCOPHAGE) 500 MG tablet [[id number]]  PCP has not prescribed this to patient before, please follow up.

## 2018-08-23 MED ORDER — METFORMIN HCL 500 MG PO TABS: 500.0000 mg | ORAL_TABLET | Freq: Two times a day (BID) | ORAL | 2 refills | Status: DC

## 2018-08-23 MED ORDER — IRBESARTAN 300 MG PO TABS: 300.0000 mg | ORAL_TABLET | Freq: Every day | ORAL | 1 refills | Status: DC

## 2018-08-23 NOTE — Telephone Encounter (Signed)
Medications refilled

## 2018-08-26 ENCOUNTER — Telehealth (HOSPITAL_COMMUNITY): Payer: Self-pay

## 2018-08-26 NOTE — Telephone Encounter (Signed)
Encounter complete. 

## 2018-08-30 ENCOUNTER — Telehealth: Payer: Self-pay

## 2018-08-30 NOTE — Telephone Encounter (Signed)
New message   Call the patient today to reschedule amyloid test for 2/26 .  Pt c/o BP issue: STAT if pt c/o blurred vision, one-sided weakness or slurred speech  1. What are your last 5 BP readings?   Today @ 9:21 am  133/102 pulse  64.  Recheck @ 10am  192/117 pulse  61-- taken in left arm   2. Are you having any other symptoms (ex. Dizziness, headache, blurred vision, passed out)? No    3. What is your BP issue? PCP increase medication--- PCP was not contacted

## 2018-08-30 NOTE — Telephone Encounter (Addendum)
On recheck blood pressure 198/96 HR 77. Patient asymptomatic Discussed with Melanie Weaver D and will have patient take blood pressure twice a day, take medications same time each day, come for HTN visit Thursday, and bring blood pressure to visit. Advised patient of recommendations and to go to ED if any blurred vision, weakness/numbness, change in speech, or worse. Patient verbalized understanding.

## 2018-08-31 ENCOUNTER — Encounter (HOSPITAL_COMMUNITY): Payer: Medicare Other

## 2018-09-01 ENCOUNTER — Ambulatory Visit (INDEPENDENT_AMBULATORY_CARE_PROVIDER_SITE_OTHER): Payer: Medicare Other | Admitting: Pharmacist Clinician (PhC)/ Clinical Pharmacy Specialist

## 2018-09-01 DIAGNOSIS — I10 Essential (primary) hypertension: Secondary | ICD-10-CM | POA: Diagnosis not present

## 2018-09-01 MED ORDER — AMLODIPINE BESYLATE 5 MG PO TABS: 5.0000 mg | ORAL_TABLET | Freq: Every day | ORAL | 3 refills | Status: DC

## 2018-09-01 NOTE — Progress Notes (Signed)
09/05/2018 Melanie Weaver [birth year] [id number]   HPI:  Melanie Weaver is a 78 y.o. female patient of Dr Percival Spanish, with a PMH below who presents today for hypertension clinic evaluation.  In addition to hypertension, her medical history is significant for DM2 and COPD.  Patient reports she has had hypertension for a number of years, was fairly well controlled until last fall when she had a bout of dizziness that sent her to the hospital.  Her Maxzide was discontinued and since then she has noted an increase in home blood pressure readings.    Patient presents in office today for hypertension evaluation.  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  He has no complaints of dizziness or lightheadedness today.     Blood Pressure Goal:  130/80  Current Medications:  Irbesartan 300 mg qd  Metoprolol succ 50 mg qd  Family Hx:  Mother had hypertension, died from stroke at age 70  Father - doesn't know any history  Multiple 1/2 siblings  1 son with hypertension, has special needs  Other son has CKD does peritoneal dialysis (pt husband recently died of CKD)  Daughter has DM2, has been able to go off meds with diet/lifestyle control.  Social Hx:  Occasional smoker, has been smoking more in 2020 due to increased stress at home; currently 1/3 to 1/2 ppd; no alcohol; 1 cup of coffee per day, mostly drinks water, only occasional sweet tea  Diet:  Mostly home cooked; only uses salt with few foods; avoids chips or snacks; does like saltines with soup;  Eats chicken and fish, not much pork; Hamburger helper   Exercise: no regular exercise  Home BP readings:  home cuff about 6 months old, she brings multiple readings in today, with many doays having 3-5 readings.  Of the 30+ readings, only 3 show a systolic reading < 166 and only 1 with diastolic < 90.     Intolerances:  maxzide caused hypotension/dehydration issues  Labs: 05/2018:  Na 138, K 3.9, Glu 121, BUN 12, SCr 1.05, GFR 57  Wt Readings from Last 3 Encounters:  09/01/18 172 lb 6.4 oz (78.2 kg)  08/16/18 169 lb 12.8 oz (77 kg)  08/11/18 169 lb (76.7 kg)   BP Readings from Last 3 Encounters:  08/16/18 (!) 148/76  08/11/18 (!) 189/69  07/21/18 140/84   Pulse Readings from Last 3 Encounters:  08/16/18 66  08/11/18 75  07/21/18 74    Current Outpatient Medications  Medication Sig Dispense Refill  . aspirin EC 81 MG tablet Take 81 mg by mouth daily.    . irbesartan (AVAPRO) 300 MG tablet Take 1 tablet (300 mg total) by mouth daily. 90 tablet 1  . metFORMIN (GLUCOPHAGE) 500 MG tablet Take 1 tablet (500 mg total) by mouth 2 (two) times daily with a meal. 180 tablet 2  . metoprolol succinate (TOPROL-XL) 50 MG 24 hr tablet Take 1 tablet (50 mg total) by mouth daily. 30 tablet 2  . Multiple Vitamin (MULTIVITAMIN WITH MINERALS) TABS tablet Take 1 tablet by mouth daily.    . pravastatin (PRAVACHOL) 80 MG tablet Take 80 mg by mouth every evening.     Marland Kitchen amLODipine (NORVASC) 5 MG tablet Take 1 tablet (5 mg total) by mouth daily. 30 tablet 3   No current facility-administered medications for this visit.  Allergies  Allergen Reactions  . Amoxicillin Diarrhea    severe  . Maxzide [Hydrochlorothiazide W-Triamterene]     Dizziness    Past Medical History:  Diagnosis Date  . Essential hypertension   . Mixed hyperlipidemia   . Parotid mass   . Pulmonary nodule   . Type 2 diabetes mellitus without complication, without long-term current use of insulin (HCC)     Height 5\' 2"  (1.575 m), weight 172 lb 6.4 oz (78.2 kg).  HTN (hypertension) Patient with hypertension currently not well controlled.  Will avoid using diuretics for now, as patient had previous syncopal episode, perhaps due to dehydration.  Will instead start her on amlodipine 5 mg once daily.  She should continue with  home BP checks, but never more than twice daily, and return in 3-4 weeks for follow up.     Tommy Medal PharmD CPP [facility] [address] [place], [place] [address] [phone]

## 2018-09-01 NOTE — Patient Instructions (Signed)
Return for a a follow up appointment with Dr. Percival Spanish on March 13  Your blood pressure today is 202/96  Check your blood pressure at home twice daily and keep record of the readings.  Take your BP meds as follows:  Start amlodipine 5 mg once daily  Continue with irbesartan and metoprolol  Bring all of your meds, your BP cuff and your record of home blood pressures to your next appointment.  Exercise as you're able, try to walk approximately 30 minutes per day.  Keep salt intake to a minimum, especially watch canned and prepared boxed foods.  Eat more fresh fruits and vegetables and fewer canned items.  Avoid eating in fast food restaurants.    HOW TO TAKE YOUR BLOOD PRESSURE: . Rest 5 minutes before taking your blood pressure. .  Don't smoke or drink caffeinated beverages for at least 30 minutes before. . Take your blood pressure before (not after) you eat. . Sit comfortably with your back supported and both feet on the floor (don't cross your legs). . Elevate your arm to heart level on a table or a desk. . Use the proper sized cuff. It should fit smoothly and snugly around your bare upper arm. There should be enough room to slip a fingertip under the cuff. The bottom edge of the cuff should be 1 inch above the crease of the elbow. . Ideally, take 3 measurements at one sitting and record the average.

## 2018-09-05 NOTE — Assessment & Plan Note (Signed)
Patient with hypertension currently not well controlled.  Will avoid using diuretics for now, as patient had previous syncopal episode, perhaps due to dehydration.  Will instead start her on amlodipine 5 mg once daily.  She should continue with home BP checks, but never more than twice daily, and return in 3-4 weeks for follow up.

## 2018-09-07 ENCOUNTER — Telehealth (HOSPITAL_COMMUNITY): Payer: Self-pay

## 2018-09-07 NOTE — Telephone Encounter (Signed)
Encounter complete. 

## 2018-09-09 ENCOUNTER — Ambulatory Visit (HOSPITAL_COMMUNITY)
Admission: RE | Admit: 2018-09-09 | Discharge: 2018-09-09 | Disposition: A | Discharge status: Home / Self Care | Payer: Medicare Other | Source: Ambulatory Visit | Attending: Cardiovascular Disease | Admitting: Cardiovascular Disease

## 2018-09-09 DIAGNOSIS — I517 Cardiomegaly: Secondary | ICD-10-CM | POA: Insufficient documentation

## 2018-09-09 MED ORDER — TECHNETIUM TC 99M PYROPHOSPHATE
21.0000 | Freq: Once | INTRAVENOUS | Status: AC
  Administered 2018-09-09: 21 via INTRAVENOUS

## 2018-09-15 ENCOUNTER — Encounter: Payer: Self-pay | Admitting: Pulmonary Disease

## 2018-09-15 ENCOUNTER — Ambulatory Visit: Payer: Medicare Other | Admitting: Pulmonary Disease

## 2018-09-15 ENCOUNTER — Other Ambulatory Visit: Payer: Self-pay

## 2018-09-15 VITALS — BP 130/80 | HR 65 | Ht 62.5 in | Wt 173.8 lb

## 2018-09-15 DIAGNOSIS — J432 Centrilobular emphysema: Secondary | ICD-10-CM | POA: Diagnosis not present

## 2018-09-15 DIAGNOSIS — J984 Other disorders of lung: Secondary | ICD-10-CM

## 2018-09-15 DIAGNOSIS — R918 Other nonspecific abnormal finding of lung field: Secondary | ICD-10-CM

## 2018-09-15 NOTE — Patient Instructions (Signed)
Follow-up in one year for PFTs

## 2018-09-15 NOTE — Progress Notes (Signed)
Subjective:   PATIENT ID: Melanie Weaver GENDER: female DOB: [birth year], MRN: [id number]   HPI  Chief Complaint  Patient presents with  . Follow-up    doing 'better each day' - less SOB     Reason for Visit: Follow-up for lung nodules  Since our last visit, she no longer has shortness of breath. Has not needed to use her albuterol. Has held off on using her Anoro and was seen by Nils Pyle, NP to review her PFTs which did not demonstrate obstructive defect. Denies recent fevers, chills, chest pain, shortness of breath. Denies weight loss or night sweats. She is still active smoker but has tried cutting back. She has had her CT scan and would like to review the results today.  Social History: Current smoker. 20 pack years. 1/2ppd x 40 years.  I have personally reviewed patient's past medical/family/social history, allergies, current medications.  Past Medical History:  Diagnosis Date  . Essential hypertension   . Mixed hyperlipidemia   . Parotid mass   . Pulmonary nodule   . Type 2 diabetes mellitus without complication, without long-term current use of insulin (HCC)      Family History  Problem Relation Age of Onset  . CVA Brother   . Diabetes Son   . Hypertension Son   . Mental retardation Son   . Stroke Son   . Diabetes Daughter      Social History   Occupational History  . Not on file  Tobacco Use  . Smoking status: Current Every Day Smoker    Packs/day: 0.50    Years: 40.00    Pack years: 20.00    Types: Cigarettes  . Smokeless tobacco: Never Used  . Tobacco comment:  1-2 cigs per day  Substance and Sexual Activity  . Alcohol use: No  . Drug use: No  . Sexual activity: Not on file    Allergies  Allergen Reactions  . Amoxicillin Diarrhea    severe  . Maxzide [Hydrochlorothiazide W-Triamterene]     Dizziness     Outpatient Medications Prior to Visit  Medication Sig Dispense Refill  . aspirin EC 81 MG tablet Take 81 mg by mouth daily.    .  irbesartan (AVAPRO) 300 MG tablet Take 1 tablet (300 mg total) by mouth daily. 90 tablet 1  . metFORMIN (GLUCOPHAGE) 500 MG tablet Take 1 tablet (500 mg total) by mouth 2 (two) times daily with a meal. 180 tablet 2  . metoprolol succinate (TOPROL-XL) 50 MG 24 hr tablet Take 1 tablet (50 mg total) by mouth daily. 30 tablet 2  . Multiple Vitamin (MULTIVITAMIN WITH MINERALS) TABS tablet Take 1 tablet by mouth daily.    . pravastatin (PRAVACHOL) 80 MG tablet Take 80 mg by mouth every evening.     Marland Kitchen amLODipine (NORVASC) 5 MG tablet Take 1 tablet (5 mg total) by mouth daily. 30 tablet 3   No facility-administered medications prior to visit.     Review of Systems  Constitutional: Negative for chills, diaphoresis, fever, malaise/fatigue and weight loss.  HENT: Negative for congestion, ear pain and sore throat.   Respiratory: Negative for cough, hemoptysis, sputum production, shortness of breath and wheezing.   Cardiovascular: Negative for chest pain, palpitations and leg swelling.  Gastrointestinal: Negative for abdominal pain, heartburn and nausea.  Genitourinary: Negative for frequency.  Musculoskeletal: Negative for joint pain and myalgias.  Skin: Negative for itching and rash.  Neurological: Negative for dizziness, weakness and headaches.  Endo/Heme/Allergies: Does not  bruise/bleed easily.  Psychiatric/Behavioral: Negative for depression. The patient is not nervous/anxious.      Objective:   Vitals:   09/15/18 1349  BP: 130/80  Pulse: 65  SpO2: 98%  Weight: 173 lb 12.8 oz (78.8 kg)  Height: 5' 2.5" (1.588 m)   SpO2: 98 %  Physical Exam: General: Well-appearing, no acute distress HENT: [place], AT, OP clear, MMM Eyes: EOMI, no scleral icterus Respiratory: Clear to auscultation bilaterally.  No crackles, wheezing or rales Cardiovascular: RRR, -M/R/G, no JVD GI: BS+, soft, nontender Extremities:-Edema,-tenderness Neuro: AAO x4, CNII-XII grossly intact Skin: Intact, no rashes or  bruising Psych: Normal mood, normal affect  Data Reviewed:  Imaging: CT Chest 08/04/18:  RML 28mm new since 04/11/13, Series 3 #93 LLL 7mm Series 3 #78 Centrilobular and paraseptal emphysema.Bronchiectasis in lower lobes R>L. 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   Labs: Glucose 08/11/18 190  Imaging, labs and tests noted above have been reviewed independently by me.    Assessment & Plan:   Discussion: 78 year old female with active tobacco abuse, hypertension, DM who presents for follow-up for lung nodules and recent pneumonia. Reviewed CT scan in deatil and PFTs with patient.  Since she is asymptomatic off bronchodilators with no evidence of obstructive disease, Anoro can be discontinued. She does have mild restrictive defect that will need to be intermittently followed.   Multiple Pulmonary Nodules CT Chest 07/2018 reviewed with patient. Lung nodules <46mm in low-risk patient. No need for follow-up imaging. Addressed her questions. We agreed to to plan of conservative management.  Mild restrictive defect Emphysema on imaging, no obstruction on PFTs No longer needing albuterol inhaler  Repeat PFTs in one year  Shortness of breath - resolved Likely related to acute respiratory illness Albuterol as needed  Health Maintenance Pneumonia - UTD Prevnar 10/18/17, Pneumovax 05/10/18 Influenza Due CT Lung Screen - DNQ  Orders Placed This Encounter  Procedures  . Pulmonary function test    Standing Status:   Future    Standing Expiration Date:   09/15/2019    Order Specific Question:   Where should this test be performed?    Answer:   [facility]    Order Specific Question:   Full PFT: includes the following: basic spirometry, spirometry pre & post  bronchodilator, diffusion capacity (DLCO), lung volumes    Answer:   Full PFT  No orders of the defined types were placed in this encounter.   Return in about 1 year (around 09/15/2019).  Greater than 50% of this patient 25-minute office visit was spent face-to-face in counseling with the patient/family. We discussed medical diagnosis and treatment plan as noted.  [place], MD [facility] 09/28/2018 3:12 PM  Office Number [phone]

## 2018-09-15 NOTE — Progress Notes (Signed)
Cardiology Office Note   Date:  09/16/2018   ID:  Melanie Weaver, DOB 05/14/1941, MRN [id number]  PCP:  Scot Jun, FNP  Cardiologist:   No primary care provider on file.  Chief Complaint  Patient presents with  . Hypertension     History of Present Illness: Melanie Weaver is a 78 y.o. female who is referred by Scot Jun, FNP for evaluation of HTN and LVH.   She had syncope on 10/31.  I reviewed hospital records.  It was thought that she was probably dehydrated at admission.  She had some mild renal insufficiency.  She was taken off of triamterene hydrochlorothiazide on discharge.  I do note that during that admission she was noted to have an echocardiogram with severe concentric left ventricular hypertrophy with an ejection fraction of 55%.   I sent her for PYP scanning but it was inconclusive.     Since I last saw her she has been recording her blood pressures on the Norvasc that was added.  Her blood pressure is not yet at target.  She has had no new symptoms however.  He denies any chest pressure, neck or arm discomfort.  He had no new palpitations, presyncope or syncope.  She is had no weight gain or edema.   Past Medical History:  Diagnosis Date  . Essential hypertension   . Mixed hyperlipidemia   . Parotid mass   . Pulmonary nodule   . Type 2 diabetes mellitus without complication, without long-term current use of insulin [facility])     Past Surgical History:  Procedure Laterality Date  . ABDOMINAL HYSTERECTOMY       Current Outpatient Medications  Medication Sig Dispense Refill  . amLODipine (NORVASC) 10 MG tablet Take 1 tablet (10 mg total) by mouth daily. 90 tablet 3  . aspirin EC 81 MG tablet Take 81 mg by mouth daily.    . irbesartan (AVAPRO) 300 MG tablet Take 1 tablet (300 mg total) by mouth daily. 90 tablet 1  . metFORMIN (GLUCOPHAGE) 500 MG tablet Take 1 tablet (500 mg total) by mouth 2 (two) times daily with a meal. 180 tablet 2  . metoprolol  succinate (TOPROL-XL) 50 MG 24 hr tablet Take 1 tablet (50 mg total) by mouth daily. 30 tablet 2  . Multiple Vitamin (MULTIVITAMIN WITH MINERALS) TABS tablet Take 1 tablet by mouth daily.    . pravastatin (PRAVACHOL) 80 MG tablet Take 80 mg by mouth every evening.      No current facility-administered medications for this visit.     Allergies:   Amoxicillin and Maxzide [hydrochlorothiazide w-triamterene]    ROS:  Please see the history of present illness.   Otherwise, review of systems are positive for none.   All other systems are reviewed and negative.    PHYSICAL EXAM: VS:  BP 132/60   Pulse 70   Ht 5' 2.5" (1.588 m)   Wt 175 lb 9.6 oz (79.7 kg)   SpO2 94%   BMI 31.61 kg/m  , BMI Body mass index is 31.61 kg/m. GENERAL:  Well appearing NECK:  No jugular venous distention, waveform within normal limits, carotid upstroke brisk and symmetric, no bruits, no thyromegaly LUNGS:  Clear to auscultation bilaterally CHEST:  Unremarkable HEART:  PMI not displaced or sustained,S1 and S2 within normal limits, no S3, no S4, no clicks, no rubs, no murmurs ABD:  Flat, positive bowel sounds normal in frequency in pitch, no bruits, no rebound, no guarding, no  midline pulsatile mass, no hepatomegaly, no splenomegaly EXT:  2 plus pulses throughout, no edema, no cyanosis no clubbing   EKG:  EKG is not ordered   Recent Labs: 05/06/2018: Magnesium 1.8 05/09/2018: BUN 12; Creatinine, Ser 1.05; Hemoglobin 17.5; Platelets 131; Potassium 3.9; Sodium 138    Lipid Panel No results found for: CHOL, TRIG, HDL, CHOLHDL, VLDL, LDLCALC, LDLDIRECT    Wt Readings from Last 3 Encounters:  09/16/18 175 lb 9.6 oz (79.7 kg)  09/15/18 173 lb 12.8 oz (78.8 kg)  09/09/18 169 lb (76.7 kg)      Other studies Reviewed: Additional studies/ records that were reviewed today include: BP diary Review of the above records demonstrates:  As above   ASSESSMENT AND PLAN:  HTN: Her blood pressure is not well  controlled.  I am going to increase her Norvasc to 10 mg daily.   LVH:   PYP scanning was negative.  I will consider having an MRI in the future.  I suspect her LVH is related to blood pressure.  Current medicines are reviewed at length with the patient today.  The patient does not have concerns regarding medicines.  The following changes have been made:  As above  Labs/ tests ordered today include:   None  No orders of the defined types were placed in this encounter.    Disposition:   FU with me 2 months.     Signed, Minus Breeding, MD  09/16/2018 5:47 PM    [place]

## 2018-09-16 ENCOUNTER — Other Ambulatory Visit: Payer: Self-pay

## 2018-09-16 ENCOUNTER — Encounter: Payer: Self-pay | Admitting: Cardiology

## 2018-09-16 ENCOUNTER — Ambulatory Visit: Payer: Medicare Other | Admitting: Cardiology

## 2018-09-16 VITALS — BP 132/60 | HR 70 | Ht 62.5 in | Wt 175.6 lb

## 2018-09-16 DIAGNOSIS — I10 Essential (primary) hypertension: Secondary | ICD-10-CM | POA: Diagnosis not present

## 2018-09-16 MED ORDER — AMLODIPINE BESYLATE 10 MG PO TABS: 10.0000 mg | ORAL_TABLET | Freq: Every day | ORAL | 3 refills | Status: DC

## 2018-09-16 NOTE — Patient Instructions (Signed)
Medication Instructions:  INCREASE- Amlodipine 10 mg daily  If you need a refill on your cardiac medications before your next appointment, please call your pharmacy.  Labwork: None Ordered HERE IN OUR OFFICE AT [facility]  You will need to fast. DO NOT EAT OR DRINK PAST MIDNIGHT.     You will NOT need to fast   Take the provided lab slips with you to the lab for your blood draw.   When you have your labs (blood work) drawn today and your tests are completely normal, you will receive your results only by MyChart Message (if you have MyChart) -OR-  A paper copy in the mail.  If you have any lab test that is abnormal or we need to change your treatment, we will call you to review these results.  Testing/Procedures: None ordered  Follow-Up: You will need a follow up appointment in 2 months.  Please call our office 2 months in advance to schedule this appointment.  You may see Dr Percival Spanish or one of the following Advanced Practice Providers on your designated Care Team:   Rosaria Ferries, PA-C . Jory Sims, DNP, ANP   At [facility], you and your health needs are our priority.  As part of our continuing mission to provide you with exceptional heart care, we have created designated Provider Care Teams.  These Care Teams include your primary Cardiologist (physician) and Advanced Practice Providers (APPs -  Physician Assistants and Nurse Practitioners) who all work together to provide you with the care you need, when you need it.  Thank you for choosing [facility] at [facility]!!

## 2018-09-22 ENCOUNTER — Ambulatory Visit: Payer: Medicare Other | Admitting: Family Medicine

## 2018-09-28 DIAGNOSIS — R918 Other nonspecific abnormal finding of lung field: Secondary | ICD-10-CM | POA: Insufficient documentation

## 2018-09-28 DIAGNOSIS — J984 Other disorders of lung: Secondary | ICD-10-CM | POA: Insufficient documentation

## 2018-11-04 ENCOUNTER — Ambulatory Visit: Payer: Medicare Other | Admitting: Family Medicine

## 2018-11-07 ENCOUNTER — Other Ambulatory Visit: Payer: Self-pay

## 2018-11-07 MED ORDER — METOPROLOL SUCCINATE ER 50 MG PO TB24: 50.0000 mg | ORAL_TABLET | Freq: Every day | ORAL | 1 refills | Status: DC

## 2018-11-09 ENCOUNTER — Telehealth: Payer: Self-pay

## 2018-11-09 NOTE — Telephone Encounter (Signed)
Contacted patient to convert office visit into a virtual visit if agreed.   Wasn't able to reach patient every time I would call it would ring and hang up and this is the only number we have on fine,

## 2018-11-16 ENCOUNTER — Ambulatory Visit: Payer: Medicare Other | Admitting: Family Medicine

## 2018-11-21 ENCOUNTER — Telehealth: Payer: Medicare Other | Admitting: Cardiology

## 2018-11-30 ENCOUNTER — Telehealth: Payer: Self-pay

## 2018-11-30 ENCOUNTER — Ambulatory Visit: Payer: Medicare Other | Admitting: Family Medicine

## 2018-11-30 NOTE — Telephone Encounter (Signed)

## 2018-12-01 ENCOUNTER — Encounter: Payer: Self-pay | Admitting: Family Medicine

## 2018-12-01 ENCOUNTER — Ambulatory Visit (INDEPENDENT_AMBULATORY_CARE_PROVIDER_SITE_OTHER): Payer: Medicare Other | Admitting: Family Medicine

## 2018-12-01 ENCOUNTER — Other Ambulatory Visit: Payer: Self-pay

## 2018-12-01 VITALS — BP 151/79 | HR 66 | Temp 98.2°F | Resp 17 | Ht 62.5 in | Wt 178.6 lb

## 2018-12-01 DIAGNOSIS — E1159 Type 2 diabetes mellitus with other circulatory complications: Secondary | ICD-10-CM | POA: Diagnosis not present

## 2018-12-01 DIAGNOSIS — I10 Essential (primary) hypertension: Secondary | ICD-10-CM

## 2018-12-01 DIAGNOSIS — F17210 Nicotine dependence, cigarettes, uncomplicated: Secondary | ICD-10-CM | POA: Diagnosis not present

## 2018-12-01 DIAGNOSIS — I517 Cardiomegaly: Secondary | ICD-10-CM | POA: Diagnosis not present

## 2018-12-01 DIAGNOSIS — Z1329 Encounter for screening for other suspected endocrine disorder: Secondary | ICD-10-CM

## 2018-12-01 MED ORDER — SPIRONOLACTONE 50 MG PO TABS: 50.0000 mg | ORAL_TABLET | Freq: Every day | ORAL | 1 refills | Status: DC

## 2018-12-01 MED ORDER — IRBESARTAN 300 MG PO TABS: 300.0000 mg | ORAL_TABLET | Freq: Every day | ORAL | 1 refills | Status: DC

## 2018-12-01 NOTE — Progress Notes (Signed)
Patient ID: Melanie Weaver, female    DOB: 1941/05/21, 79 y.o.   MRN: [id number]  PCP: Scot Jun, FNP  Chief Complaint  Patient presents with  . Diabetes  . Hypertension  . Hyperlipidemia    Subjective:  HPI  Melanie Weaver is a 78 y.o. female presents for hypertension and diabetes follow-up.  [place] medical problems are significant for has Syncope; HTN (hypertension); DM2 (diabetes mellitus, type 2) ([place]); Centrilobular emphysema ([place]); LVH (left ventricular hypertrophy); Restrictive lung disease; and Multiple lung nodules on their problem list.   Diabetes Routinely monitor his blood sugar at home.  A1c has been historically stable.  Last A1c was in December at 6.4.  She routinely checks her blood sugar at home and reports no readings greater than 130 and fasting readings are consistently in the 90s.  Diabetes is managed with metformin 500 mg twice daily.  She denies any neuropathic pain, polyphagia, polydipsia, polyuria.  She is an active of routine physical activity.  She is a current daily smoker. Hypertension, uncontrolled Patient monitors blood pressure at home.  Reports blood pressure readings have been all over the place however none have been below 140/80.  Blood pressure has been historically difficult to control.  Patient was previously prescribed hydrochlorothiazide however developed an intolerance to the medication as it caused severe dizziness.  She was seen in the hypertension clinic at heart care and was recently started on amlodipine 5 mg once daily which has since been increased amlodipine 10 mg once daily as her last visit with cardiology.  Since this change patient denies any lower extremity swelling or dizziness.  However blood pressures have only mildly improved and are still not at goal.  Patient is asymptomatic of headache, chest pain, shortness of breath, dizziness or new weakness.  Current regimen consist of amlodipine 10 mg, metoprolol 50 mg once  daily, and Irbesartan 300 mg once daily. Social History   Socioeconomic History  . Marital status: Married    Spouse name: Not on file  . Number of children: Not on file  . Years of education: Not on file  . Highest education level: Not on file  Occupational History  . Not on file  Social Needs  . Financial resource strain: Not on file  . Food insecurity:    Worry: Not on file    Inability: Not on file  . Transportation needs:    Medical: Not on file    Non-medical: Not on file  Tobacco Use  . Smoking status: Current Every Day Smoker    Packs/day: 0.50    Years: 40.00    Pack years: 20.00    Types: Cigarettes  . Smokeless tobacco: Never Used  . Tobacco comment:  1-2 cigs per day  Substance and Sexual Activity  . Alcohol use: No  . Drug use: No  . Sexual activity: Not on file  Lifestyle  . Physical activity:    Days per week: Not on file    Minutes per session: Not on file  . Stress: Not on file  Relationships  . Social connections:    Talks on phone: Not on file    Gets together: Not on file    Attends religious service: Not on file    Active member of club or organization: Not on file    Attends meetings of clubs or organizations: Not on file    Relationship status: Not on file  . Intimate partner violence:    Fear of current or  ex partner: Not on file    Emotionally abused: Not on file    Physically abused: Not on file    Forced sexual activity: Not on file  Other Topics Concern  . Not on file  Social History Narrative   Lives with son with who is mentally handicapped.    Family History  Problem Relation Age of Onset  . CVA Brother   . Diabetes Son   . Hypertension Son   . Mental retardation Son   . Stroke Son   . Diabetes Daughter      Review of Systems Pertinent negatives listed in HPI Patient Active Problem List   Diagnosis Date Noted  . 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 Multiple lung nodules 09/28/2018  . LVH (left ventricular  hypertrophy) 08/16/2018  . Syncope 05/05/2018  . HTN (hypertension) 05/05/2018  . DM2 (diabetes mellitus, type 2) ([place]) 05/05/2018  . Centrilobular emphysema ([place]) 05/05/2018    Allergies  Allergen Reactions  . Amoxicillin Diarrhea    severe  . Maxzide [Hydrochlorothiazide W-Triamterene]     Dizziness    Prior to Admission medications   Medication Sig Start Date End Date Taking? Authorizing Provider  amLODipine (NORVASC) 10 MG tablet Take 1 tablet (10 mg total) by mouth daily. 09/16/18 12/15/18 Yes Minus Breeding, MD  aspirin EC 81 MG tablet Take 81 mg by mouth daily.   Yes [provider]  metFORMIN (GLUCOPHAGE) 500 MG tablet Take 1 tablet (500 mg total) by mouth 2 (two) times daily with a meal. 08/23/18  Yes Scot Jun, FNP  metoprolol succinate (TOPROL-XL) 50 MG 24 hr tablet Take 1 tablet (50 mg total) by mouth daily. 11/07/18  Yes Scot Jun, FNP  Multiple Vitamin (MULTIVITAMIN WITH MINERALS) TABS tablet Take 1 tablet by mouth daily.   Yes [provider]  pravastatin (PRAVACHOL) 80 MG tablet Take 80 mg by mouth every evening.    Yes [provider]  irbesartan (AVAPRO) 300 MG tablet Take 1 tablet (300 mg total) by mouth daily. 12/01/18 05/30/19  Scot Jun, FNP  spironolactone (ALDACTONE) 50 MG tablet Take 1 tablet (50 mg total) by mouth daily. 12/01/18   Scot Jun, FNP    Past Medical, Surgical Family and Social History reviewed and updated.    Objective:   Today's Vitals   12/01/18 1052  BP: (!) 151/79  Pulse: 66  Resp: 17  Temp: 98.2 F (36.8 C)  TempSrc: Temporal  SpO2: 95%  Weight: 178 lb 9.6 oz (81 kg)  Height: 5' 2.5" (1.588 m)    Wt Readings from Last 3 Encounters:  12/01/18 178 lb 9.6 oz (81 kg)  09/16/18 175 lb 9.6 oz (79.7 kg)  09/15/18 173 lb 12.8 oz (78.8 kg)     Physical Exam General appearance: alert, well developed, well nourished, cooperative and in no distress Head: Normocephalic,  without obvious abnormality, atraumatic Respiratory: Respirations even and unlabored, normal respiratory rate Heart: rate and rhythm normal. No gallop or murmurs noted on exam  Abdomen: BS +, no distention, no rebound tenderness, or no mass Extremities: No gross deformities Skin: Skin color, texture, turgor normal. No rashes seen  Psych: Appropriate mood and affect. Neurologic: Mental status: Alert, oriented to person, place, and time, thought content appropriate. Lab Results  Component Value Date   POCGLU 190 (A) 08/11/2018    No results found for: HGBA1C   Assessment & Plan:  1. Accelerated hypertension Continue amlodipine at 10 mg daily, metoprolol 50 mg  extended release, and adding spironolactone 50 mg once daily in efforts to improve blood pressure.  Patient will continue to monitor blood pressure readings at home.  Given face and spironolactone both potassium sparing we will closely monitor potassium level.  Patient to return in 2 weeks for blood pressure recheck. - Comprehensive metabolic panel  2. Type 2 diabetes mellitus with other circulatory complication, without long-term current use of insulin ([place]) Home readings stable.  We will repeat an A1c.  At present no indication for medication adjustment given patient's home readings.  Continue metformin 500 mg twice daily. - Hemoglobin A1c  3. Screening for thyroid disorder - Thyroid Panel With TSH    Follow-up: 2-week blood pressure check with nurse, 55-month follow-up with provider hypertension and diabetes.  -The patient was given clear instructions to go to ER or return to medical center if symptoms do not improve, worsen or new problems develop. The patient verbalized understanding.    Molli Barrows, FNP Primary Care at [facility] [address], [place] [phone]: [phone]

## 2018-12-01 NOTE — Patient Instructions (Signed)
DASH Eating Plan  DASH stands for "Dietary Approaches to Stop Hypertension." The DASH eating plan is a healthy eating plan that has been shown to reduce high blood pressure (hypertension). It may also reduce your risk for type 2 diabetes, heart disease, and stroke. The DASH eating plan may also help with weight loss.  What are tips for following this plan?    General guidelines   Avoid eating more than 2,300 mg (milligrams) of salt (sodium) a day. If you have hypertension, you may need to reduce your sodium intake to 1,500 mg a day.   Limit alcohol intake to no more than 1 drink a day for nonpregnant women and 2 drinks a day for men. One drink equals 12 oz of beer, 5 oz of wine, or 1 oz of hard liquor.   Work with your health care provider to maintain a healthy body weight or to lose weight. Ask what an ideal weight is for you.   Get at least 30 minutes of exercise that causes your heart to beat faster (aerobic exercise) most days of the week. Activities may include walking, swimming, or biking.   Work with your health care provider or diet and nutrition specialist (dietitian) to adjust your eating plan to your individual calorie needs.  Reading food labels     Check food labels for the amount of sodium per serving. Choose foods with less than 5 percent of the Daily Value of sodium. Generally, foods with less than 300 mg of sodium per serving fit into this eating plan.   To find whole grains, look for the word "whole" as the first word in the ingredient list.  Shopping   Buy products labeled as "low-sodium" or "no salt added."   Buy fresh foods. Avoid canned foods and premade or frozen meals.  Cooking   Avoid adding salt when cooking. Use salt-free seasonings or herbs instead of table salt or sea salt. Check with your health care provider or pharmacist before using salt substitutes.   Do not fry foods. Cook foods using healthy methods such as baking, boiling, grilling, and broiling instead.   Cook with  heart-healthy oils, such as olive, canola, soybean, or sunflower oil.  Meal planning   Eat a balanced diet that includes:  ? 5 or more servings of fruits and vegetables each day. At each meal, try to fill half of your plate with fruits and vegetables.  ? Up to 6-8 servings of whole grains each day.  ? Less than 6 oz of lean meat, poultry, or fish each day. A 3-oz serving of meat is about the same size as a deck of cards. One egg equals 1 oz.  ? 2 servings of low-fat dairy each day.  ? A serving of nuts, seeds, or beans 5 times each week.  ? Heart-healthy fats. Healthy fats called Omega-3 fatty acids are found in foods such as flaxseeds and coldwater fish, like sardines, salmon, and mackerel.   Limit how much you eat of the following:  ? Canned or prepackaged foods.  ? Food that is high in trans fat, such as fried foods.  ? Food that is high in saturated fat, such as fatty meat.  ? Sweets, desserts, sugary drinks, and other foods with added sugar.  ? Full-fat dairy products.   Do not salt foods before eating.   Try to eat at least 2 vegetarian meals each week.   Eat more home-cooked food and less restaurant, buffet, and fast food.     When eating at a restaurant, ask that your food be prepared with less salt or no salt, if possible.  What foods are recommended?  The items listed may not be a complete list. Talk with your dietitian about what dietary choices are best for you.  Grains  Whole-grain or whole-wheat bread. Whole-grain or whole-wheat pasta. Brown rice. Oatmeal. Quinoa. Bulgur. Whole-grain and low-sodium cereals. Pita bread. Low-fat, low-sodium crackers. Whole-wheat flour tortillas.  Vegetables  Fresh or frozen vegetables (raw, steamed, roasted, or grilled). Low-sodium or reduced-sodium tomato and vegetable juice. Low-sodium or reduced-sodium tomato sauce and tomato paste. Low-sodium or reduced-sodium canned vegetables.  Fruits  All fresh, dried, or frozen fruit. Canned fruit in natural juice (without  added sugar).  Meat and other protein foods  Skinless chicken or turkey. Ground chicken or turkey. Pork with fat trimmed off. Fish and seafood. Egg whites. Dried beans, peas, or lentils. Unsalted nuts, nut butters, and seeds. Unsalted canned beans. Lean cuts of beef with fat trimmed off. Low-sodium, lean deli meat.  Dairy  Low-fat (1%) or fat-free (skim) milk. Fat-free, low-fat, or reduced-fat cheeses. Nonfat, low-sodium ricotta or cottage cheese. Low-fat or nonfat yogurt. Low-fat, low-sodium cheese.  Fats and oils  Soft margarine without trans fats. Vegetable oil. Low-fat, reduced-fat, or light mayonnaise and salad dressings (reduced-sodium). Canola, safflower, olive, soybean, and sunflower oils. Avocado.  Seasoning and other foods  Herbs. Spices. Seasoning mixes without salt. Unsalted popcorn and pretzels. Fat-free sweets.  What foods are not recommended?  The items listed may not be a complete list. Talk with your dietitian about what dietary choices are best for you.  Grains  Baked goods made with fat, such as croissants, muffins, or some breads. Dry pasta or rice meal packs.  Vegetables  Creamed or fried vegetables. Vegetables in a cheese sauce. Regular canned vegetables (not low-sodium or reduced-sodium). Regular canned tomato sauce and paste (not low-sodium or reduced-sodium). Regular tomato and vegetable juice (not low-sodium or reduced-sodium). Pickles. Olives.  Fruits  Canned fruit in a light or heavy syrup. Fried fruit. Fruit in cream or butter sauce.  Meat and other protein foods  Fatty cuts of meat. Ribs. Fried meat. Bacon. Sausage. Bologna and other processed lunch meats. Salami. Fatback. Hotdogs. Bratwurst. Salted nuts and seeds. Canned beans with added salt. Canned or smoked fish. Whole eggs or egg yolks. Chicken or turkey with skin.  Dairy  Whole or 2% milk, cream, and half-and-half. Whole or full-fat cream cheese. Whole-fat or sweetened yogurt. Full-fat cheese. Nondairy creamers. Whipped toppings.  Processed cheese and cheese spreads.  Fats and oils  Butter. Stick margarine. Lard. Shortening. Ghee. Bacon fat. Tropical oils, such as coconut, palm kernel, or palm oil.  Seasoning and other foods  Salted popcorn and pretzels. Onion salt, garlic salt, seasoned salt, table salt, and sea salt. Worcestershire sauce. Tartar sauce. Barbecue sauce. Teriyaki sauce. Soy sauce, including reduced-sodium. Steak sauce. Canned and packaged gravies. Fish sauce. Oyster sauce. Cocktail sauce. Horseradish that you find on the shelf. Ketchup. Mustard. Meat flavorings and tenderizers. Bouillon cubes. Hot sauce and Tabasco sauce. Premade or packaged marinades. Premade or packaged taco seasonings. Relishes. Regular salad dressings.  Where to find more information:   National Heart, Lung, and Blood Institute: www.nhlbi.nih.gov   American Heart Association: www.heart.org  Summary   The DASH eating plan is a healthy eating plan that has been shown to reduce high blood pressure (hypertension). It may also reduce your risk for type 2 diabetes, heart disease, and stroke.   With the   DASH eating plan, you should limit salt (sodium) intake to 2,300 mg a day. If you have hypertension, you may need to reduce your sodium intake to 1,500 mg a day.   When on the DASH eating plan, aim to eat more fresh fruits and vegetables, whole grains, lean proteins, low-fat dairy, and heart-healthy fats.   Work with your health care provider or diet and nutrition specialist (dietitian) to adjust your eating plan to your individual calorie needs.  This information is not intended to replace advice given to you by your health care provider. Make sure you discuss any questions you have with your health care provider.  Document Released: 06/11/2011 Document Revised: 06/15/2016 Document Reviewed: 06/15/2016  Elsevier Interactive Patient Education  2019 [employer]

## 2018-12-02 LAB — THYROID PANEL WITH TSH
Free Thyroxine Index: 2 (ref 1.2–4.9)
T3 Uptake Ratio: 27 % (ref 24–39)
T4, Total: 7.3 ug/dL (ref 4.5–12.0)
TSH: 1.5 u[IU]/mL (ref 0.450–4.500)

## 2018-12-02 LAB — COMPREHENSIVE METABOLIC PANEL
ALT: 16 IU/L (ref 0–32)
AST: 25 IU/L (ref 0–40)
Albumin/Globulin Ratio: 2 (ref 1.2–2.2)
Albumin: 4.4 g/dL (ref 3.7–4.7)
Alkaline Phosphatase: 111 IU/L (ref 39–117)
BUN/Creatinine Ratio: 24 (ref 12–28)
BUN: 23 mg/dL (ref 8–27)
Bilirubin Total: 0.5 mg/dL (ref 0.0–1.2)
CO2: 21 mmol/L (ref 20–29)
Calcium: 9.9 mg/dL (ref 8.7–10.3)
Chloride: 101 mmol/L (ref 96–106)
Creatinine, Ser: 0.96 mg/dL (ref 0.57–1.00)
GFR calc Af Amer: 66 mL/min/{1.73_m2} (ref >59)
GFR calc non Af Amer: 57 mL/min/{1.73_m2} — ABNORMAL LOW (ref >59)
Globulin, Total: 2.2 g/dL (ref 1.5–4.5)
Glucose: 93 mg/dL (ref 65–99)
Potassium: 5 mmol/L (ref 3.5–5.2)
Sodium: 138 mmol/L (ref 134–144)
Total Protein: 6.6 g/dL (ref 6.0–8.5)

## 2018-12-02 LAB — HEMOGLOBIN A1C
Est. average glucose Bld gHb Est-mCnc: 140 mg/dL
Hgb A1c MFr Bld: 6.5 % — ABNORMAL HIGH (ref 4.8–5.6)

## 2018-12-06 ENCOUNTER — Telehealth: Payer: Self-pay | Admitting: Family Medicine

## 2018-12-06 NOTE — Telephone Encounter (Signed)
Please schedule patient for a nurse visit on 6/8 at the same time as her son's appointment.

## 2018-12-12 ENCOUNTER — Ambulatory Visit (INDEPENDENT_AMBULATORY_CARE_PROVIDER_SITE_OTHER): Payer: Medicare Other

## 2018-12-12 VITALS — BP 142/63 | HR 75

## 2018-12-12 DIAGNOSIS — I10 Essential (primary) hypertension: Secondary | ICD-10-CM

## 2018-12-12 DIAGNOSIS — Z0130 Encounter for examination of blood pressure without abnormal findings: Secondary | ICD-10-CM | POA: Diagnosis not present

## 2018-12-12 NOTE — Progress Notes (Signed)
Patient here for BP check. After sitting patient's BP was 142/63, pulse 75. Patient did mention that she was taking two tablets of Avapro daily. Spoke with provider who states that Avapro is to be one tablet daily. 300 mg is the max dose. Went over patient medication doses again & informed her to keep appointment on 01/12/2019. KWalker, CMA.

## 2018-12-30 ENCOUNTER — Other Ambulatory Visit: Payer: Self-pay | Admitting: Cardiology

## 2019-01-11 ENCOUNTER — Telehealth: Payer: Self-pay

## 2019-01-11 NOTE — Telephone Encounter (Signed)

## 2019-01-12 ENCOUNTER — Encounter: Payer: Self-pay | Admitting: Family Medicine

## 2019-01-12 ENCOUNTER — Other Ambulatory Visit: Payer: Self-pay

## 2019-01-12 ENCOUNTER — Ambulatory Visit (INDEPENDENT_AMBULATORY_CARE_PROVIDER_SITE_OTHER): Payer: Medicare Other | Admitting: Family Medicine

## 2019-01-12 VITALS — BP 133/66 | HR 61 | Temp 97.3°F | Resp 17 | Ht 62.5 in | Wt 178.0 lb

## 2019-01-12 DIAGNOSIS — F17210 Nicotine dependence, cigarettes, uncomplicated: Secondary | ICD-10-CM | POA: Diagnosis not present

## 2019-01-12 DIAGNOSIS — I10 Essential (primary) hypertension: Secondary | ICD-10-CM | POA: Diagnosis not present

## 2019-01-12 NOTE — Patient Instructions (Signed)

## 2019-01-12 NOTE — Progress Notes (Deleted)
Patient ID: Melanie Weaver, female    DOB: [birth year], 78 y.o.   MRN: [id number]  PCP: Scot Jun, FNP  Chief Complaint  Patient presents with  . Hypertension    Subjective:  HPI  Melanie Weaver is a 78 y.o. female presents for evaluation   has Syncope; HTN (hypertension); DM2 (diabetes mellitus, type 2) ([place]); Centrilobular emphysema ([place]); LVH (left ventricular hypertrophy); Restrictive lung disease; and Multiple lung nodules on their problem list.   Today's visit:  Social History   Socioeconomic History  . Marital status: Married    Spouse name: Not on file  . Number of children: Not on file  . Years of education: Not on file  . Highest education level: Not on file  Occupational History  . Not on file  Social Needs  . Financial resource strain: Not on file  . Food insecurity    Worry: Not on file    Inability: Not on file  . Transportation needs    Medical: Not on file    Non-medical: Not on file  Tobacco Use  . Smoking status: Current Every Day Smoker    Packs/day: 0.50    Years: 40.00    Pack years: 20.00    Types: Cigarettes  . Smokeless tobacco: Never Used  . Tobacco comment:  1-2 cigs per day  Substance and Sexual Activity  . Alcohol use: No  . Drug use: No  . Sexual activity: Not on file  Lifestyle  . Physical activity    Days per week: Not on file    Minutes per session: Not on file  . Stress: Not on file  Relationships  . Social Herbalist on phone: Not on file    Gets together: Not on file    Attends religious service: Not on file    Active member of club or organization: Not on file    Attends meetings of clubs or organizations: Not on file    Relationship status: Not on file  . Intimate partner violence    Fear of current or ex partner: Not on file    Emotionally abused: Not on file    Physically abused: Not on file    Forced sexual activity: Not on file  Other Topics Concern  . Not on file  Social History Narrative    Lives with son with who is mentally handicapped.    Family History  Problem Relation Age of Onset  . CVA Brother   . Diabetes Son   . Hypertension Son   . Mental retardation Son   . Stroke Son   . Diabetes Daughter      Review of Systems  Allergies  Allergen Reactions  . Amoxicillin Diarrhea    severe  . Maxzide [Hydrochlorothiazide W-Triamterene]     Dizziness    Prior to Admission medications   Medication Sig Start Date End Date Taking? Authorizing Provider  aspirin EC 81 MG tablet Take 81 mg by mouth daily.   Yes [provider]  irbesartan (AVAPRO) 300 MG tablet Take 1 tablet (300 mg total) by mouth daily. 12/01/18 05/30/19 Yes Scot Jun, FNP  metFORMIN (GLUCOPHAGE) 500 MG tablet Take 1 tablet (500 mg total) by mouth 2 (two) times daily with a meal. 08/23/18  Yes Scot Jun, FNP  metoprolol succinate (TOPROL-XL) 50 MG 24 hr tablet Take 1 tablet (50 mg total) by mouth daily. 11/07/18  Yes Scot Jun, FNP  Multiple Vitamin (MULTIVITAMIN WITH MINERALS)  TABS tablet Take 1 tablet by mouth daily.   Yes [provider]  pravastatin (PRAVACHOL) 80 MG tablet Take 80 mg by mouth every evening.    Yes [provider]  spironolactone (ALDACTONE) 50 MG tablet Take 1 tablet (50 mg total) by mouth daily. 12/01/18  Yes Scot Jun, FNP  amLODipine (NORVASC) 10 MG tablet Take 1 tablet (10 mg total) by mouth daily. 09/16/18 12/15/18  Minus Breeding, MD    Past Medical, Surgical Family and Social History reviewed and updated.    Objective:   Today's Vitals   01/12/19 1053  BP: 133/66  Pulse: 61  Resp: 17  Temp: (!) 97.3 F (36.3 C)  TempSrc: Temporal  SpO2: 94%  Weight: 178 lb (80.7 kg)  Height: 5' 2.5" (1.588 m)    BP Readings from Last 3 Encounters:  01/12/19 133/66  12/12/18 (!) 142/63  12/01/18 (!) 151/79    Filed Weights   01/12/19 1053  Weight: 178 lb (80.7 kg)       Physical Exam General appearance:  alert, well developed, well nourished, cooperative and in no distress Head: Normocephalic, without obvious abnormality, atraumatic Respiratory: Respirations even and unlabored, normal respiratory rate Heart: rate and rhythm normal. No gallop or murmurs noted on exam  Extremities: No gross deformities Skin: Skin color, texture, turgor normal. No rashes seen  Psych: Appropriate mood and affect. Neurologic: Mental status: Alert, oriented to person, place, and time, thought content appropriate.  Lab Results  Component Value Date   POCGLU 190 (A) 08/11/2018    Lab Results  Component Value Date   HGBA1C 6.5 (H) 12/01/2018            Assessment & Plan:  There are no diagnoses linked to this encounter.      Molli Barrows, FNP Primary Care at [facility] [address], [place] [phone]: [phone]

## 2019-01-13 LAB — COMPREHENSIVE METABOLIC PANEL
ALT: 16 IU/L (ref 0–32)
AST: 18 IU/L (ref 0–40)
Albumin/Globulin Ratio: 1.8 (ref 1.2–2.2)
Albumin: 4.5 g/dL (ref 3.7–4.7)
Alkaline Phosphatase: 103 IU/L (ref 39–117)
BUN/Creatinine Ratio: 25 (ref 12–28)
BUN: 26 mg/dL (ref 8–27)
Bilirubin Total: 0.7 mg/dL (ref 0.0–1.2)
CO2: 19 mmol/L — ABNORMAL LOW (ref 20–29)
Calcium: 10.3 mg/dL (ref 8.7–10.3)
Chloride: 101 mmol/L (ref 96–106)
Creatinine, Ser: 1.05 mg/dL — ABNORMAL HIGH (ref 0.57–1.00)
GFR calc Af Amer: 59 mL/min/{1.73_m2} — ABNORMAL LOW (ref >59)
GFR calc non Af Amer: 51 mL/min/{1.73_m2} — ABNORMAL LOW (ref >59)
Globulin, Total: 2.5 g/dL (ref 1.5–4.5)
Glucose: 125 mg/dL — ABNORMAL HIGH (ref 65–99)
Potassium: 5.1 mmol/L (ref 3.5–5.2)
Sodium: 137 mmol/L (ref 134–144)
Total Protein: 7 g/dL (ref 6.0–8.5)

## 2019-01-16 ENCOUNTER — Telehealth (INDEPENDENT_AMBULATORY_CARE_PROVIDER_SITE_OTHER): Payer: Self-pay

## 2019-01-16 NOTE — Telephone Encounter (Signed)
-----   Message from Scot Jun, [place] sent at 01/15/2019  5:49 PM EDT ----- Labs are stable. Potassium normal

## 2019-01-16 NOTE — Telephone Encounter (Signed)
Patient verified date of birth. She is aware that labs are stable and potassium is normal. Patient expressed understanding, she did not have any questions. Nat Christen, CMA

## 2019-01-17 NOTE — Progress Notes (Signed)
Subjective:    Patient here for follow-up of elevated blood pressure.  She is not exercising and is not adherent to a low-salt diet. She continues to smoke. Blood pressure is well controlled at home since adding Spirolactone. Cardiac symptoms: none. Patient denies: chest pain, chest pressure/discomfort, dyspnea, exertional chest pressure/discomfort, fatigue and lower extremity edema. Cardiovascular risk factors: advanced age (older than 56 for men, 76 for women), diabetes mellitus, obesity (BMI >= 30 kg/m2) and smoking/ tobacco exposure. Use of agents associated with hypertension: none. History of target organ damage: heart failure and left ventricular hypertrophy.    Review of Systems Pertinent negatives listed in HPI   Objective:    BP 133/66   Pulse 61   Temp (!) 97.3 F (36.3 C) (Temporal)   Resp 17   Ht 5' 2.5" (1.588 m)   Wt 178 lb (80.7 kg)   SpO2 94%   BMI 32.04 kg/m    General appearance: alert, cooperative and appears stated age Lungs: clear to auscultation bilaterally Heart: regular rate and rhythm, S1, S2 normal, no murmur, click, rub or gallop Neurologic: Alert and oriented X 3, normal strength and tone. Normal symmetric reflexes. Normal coordination and gait Coordination: normal Gait: Normal    Assessment:    Hypertension, stable today, improved control. Evidence of target organ damage: heart failure and left ventricular hypertrophy.    Plan:  Encourage smoking cessation, increase intake of vegetables and lean cuts of meat.  Decrease foods high in sodium.  Encourage some form of routine activity as tolerated even if is a walk for no more than 10 or 15 minutes on most days of the week.  Continue medications as prescribed and close follow-up with cardiology.  Return for follow-up in 3 months for hypertension follow-up.  Molli Barrows, FNP Primary Care at [facility] [address], [place] [phone]: [phone]

## 2019-01-24 ENCOUNTER — Other Ambulatory Visit: Payer: Self-pay

## 2019-01-24 MED ORDER — SPIRONOLACTONE 50 MG PO TABS: 50.0000 mg | ORAL_TABLET | Freq: Every day | ORAL | 0 refills | Status: DC

## 2019-03-06 ENCOUNTER — Telehealth: Payer: Self-pay | Admitting: Family Medicine

## 2019-03-06 MED ORDER — IRBESARTAN 300 MG PO TABS: 300.0000 mg | ORAL_TABLET | Freq: Every day | ORAL | 0 refills | Status: DC

## 2019-03-06 MED ORDER — METOPROLOL SUCCINATE ER 50 MG PO TB24: 50.0000 mg | ORAL_TABLET | Freq: Every day | ORAL | 0 refills | Status: DC

## 2019-03-06 NOTE — Telephone Encounter (Signed)
Pt would like a refill on -irbesartan (AVAPRO) 300 MG tablet   Please follow up -[place], [place]   Special request-Patients pharmacy is still refilling  -metoprolol succinate (25MG ) please clear to pharmacy that pt is supposed to be taking -metoprolol succinate (TOPROL-XL) 50 MG 24 hr tablet

## 2019-03-06 NOTE — Telephone Encounter (Signed)
Prescriptions sent to pharmacy

## 2019-04-14 ENCOUNTER — Telehealth: Payer: Self-pay

## 2019-04-14 NOTE — Telephone Encounter (Signed)

## 2019-04-17 ENCOUNTER — Other Ambulatory Visit: Payer: Self-pay

## 2019-04-17 ENCOUNTER — Encounter: Payer: Self-pay | Admitting: Family Medicine

## 2019-04-17 ENCOUNTER — Ambulatory Visit (INDEPENDENT_AMBULATORY_CARE_PROVIDER_SITE_OTHER): Payer: Medicare Other | Admitting: Family Medicine

## 2019-04-17 VITALS — BP 143/73 | HR 74 | Temp 97.3°F | Resp 17 | Wt 185.0 lb

## 2019-04-17 DIAGNOSIS — I739 Peripheral vascular disease, unspecified: Secondary | ICD-10-CM

## 2019-04-17 DIAGNOSIS — M545 Low back pain, unspecified: Secondary | ICD-10-CM

## 2019-04-17 DIAGNOSIS — R918 Other nonspecific abnormal finding of lung field: Secondary | ICD-10-CM

## 2019-04-17 DIAGNOSIS — M51369 Other intervertebral disc degeneration, lumbar region without mention of lumbar back pain or lower extremity pain: Secondary | ICD-10-CM

## 2019-04-17 DIAGNOSIS — F17200 Nicotine dependence, unspecified, uncomplicated: Secondary | ICD-10-CM

## 2019-04-17 DIAGNOSIS — R590 Localized enlarged lymph nodes: Secondary | ICD-10-CM

## 2019-04-17 DIAGNOSIS — J449 Chronic obstructive pulmonary disease, unspecified: Secondary | ICD-10-CM

## 2019-04-17 DIAGNOSIS — F17210 Nicotine dependence, cigarettes, uncomplicated: Secondary | ICD-10-CM

## 2019-04-17 DIAGNOSIS — E1159 Type 2 diabetes mellitus with other circulatory complications: Secondary | ICD-10-CM

## 2019-04-17 DIAGNOSIS — I10 Essential (primary) hypertension: Secondary | ICD-10-CM

## 2019-04-17 DIAGNOSIS — E1169 Type 2 diabetes mellitus with other specified complication: Secondary | ICD-10-CM

## 2019-04-17 DIAGNOSIS — Z79899 Other long term (current) drug therapy: Secondary | ICD-10-CM

## 2019-04-17 DIAGNOSIS — E785 Hyperlipidemia, unspecified: Secondary | ICD-10-CM

## 2019-04-17 DIAGNOSIS — M5136 Other intervertebral disc degeneration, lumbar region: Secondary | ICD-10-CM

## 2019-04-17 NOTE — Patient Instructions (Signed)
Steps to Quit Smoking Smoking tobacco is the leading cause of preventable death. It can affect almost every organ in the body. Smoking puts you and people around you at risk for many serious, long-lasting (chronic) diseases. Quitting smoking can be hard, but it is one of the best things that you can do for your health. It is never too late to quit. How do I get ready to quit? When you decide to quit smoking, make a plan to help you succeed. Before you quit:  Pick a date to quit. Set a date within the next 2 weeks to give you time to prepare.  Write down the reasons why you are quitting. Keep this list in places where you will see it often.  Tell your family, friends, and co-workers that you are quitting. Their support is important.  Talk with your doctor about the choices that may help you quit.  Find out if your health insurance will pay for these treatments.  Know the people, places, things, and activities that make you want to smoke (triggers). Avoid them. What first steps can I take to quit smoking?  Throw away all cigarettes at home, at work, and in your car.  Throw away the things that you use when you smoke, such as ashtrays and lighters.  Clean your car. Make sure to empty the ashtray.  Clean your home, including curtains and carpets. What can I do to help me quit smoking? Talk with your doctor about taking medicines and seeing a counselor at the same time. You are more likely to succeed when you do both.  If you are pregnant or breastfeeding, talk with your doctor about counseling or other ways to quit smoking. Do not take medicine to help you quit smoking unless your doctor tells you to do so. To quit smoking: Quit right away  Quit smoking totally, instead of slowly cutting back on how much you smoke over a period of time.  Go to counseling. You are more likely to quit if you go to counseling sessions regularly. Take medicine You may take medicines to help you quit. Some  medicines need a prescription, and some you can buy over-the-counter. Some medicines may contain a drug called nicotine to replace the nicotine in cigarettes. Medicines may:  Help you to stop having the desire to smoke (cravings).  Help to stop the problems that come when you stop smoking (withdrawal symptoms). Your doctor may ask you to use:  Nicotine patches, gum, or lozenges.  Nicotine inhalers or sprays.  Non-nicotine medicine that is taken by mouth. Find resources Find resources and other ways to help you quit smoking and remain smoke-free after you quit. These resources are most helpful when you use them often. They include:  Online chats with a counselor.  Phone quitlines.  Printed self-help materials.  Support groups or group counseling.  Text messaging programs.  Mobile phone apps. Use apps on your mobile phone or tablet that can help you stick to your quit plan. There are many free apps for mobile phones and tablets as well as websites. Examples include Quit Guide from the CDC and smokefree.gov  What things can I do to make it easier to quit?   Talk to your family and friends. Ask them to support and encourage you.  Call a phone quitline (1-800-QUIT-NOW), reach out to support groups, or work with a counselor.  Ask people who smoke to not smoke around you.  Avoid places that make you want to smoke,   such as: ? Bars. ? Parties. ? Smoke-break areas at work.  Spend time with people who do not smoke.  Lower the stress in your life. Stress can make you want to smoke. Try these things to help your stress: ? Getting regular exercise. ? Doing deep-breathing exercises. ? Doing yoga. ? Meditating. ? Doing a body scan. To do this, close your eyes, focus on one area of your body at a time from head to toe. Notice which parts of your body are tense. Try to relax the muscles in those areas. How will I feel when I quit smoking? Day 1 to 3 weeks Within the first 24 hours,  you may start to have some problems that come from quitting tobacco. These problems are very bad 2-3 days after you quit, but they do not often last for more than 2-3 weeks. You may get these symptoms:  Mood swings.  Feeling restless, nervous, angry, or annoyed.  Trouble concentrating.  Dizziness.  Strong desire for high-sugar foods and nicotine.  Weight gain.  Trouble pooping (constipation).  Feeling like you may vomit (nausea).  Coughing or a sore throat.  Changes in how the medicines that you take for other issues work in your body.  Depression.  Trouble sleeping (insomnia). Week 3 and afterward After the first 2-3 weeks of quitting, you may start to notice more positive results, such as:  Better sense of smell and taste.  Less coughing and sore throat.  Slower heart rate.  Lower blood pressure.  Clearer skin.  Better breathing.  Fewer sick days. Quitting smoking can be hard. Do not give up if you fail the first time. Some people need to try a few times before they succeed. Do your best to stick to your quit plan, and talk with your doctor if you have any questions or concerns. Summary  Smoking tobacco is the leading cause of preventable death. Quitting smoking can be hard, but it is one of the best things that you can do for your health.  When you decide to quit smoking, make a plan to help you succeed.  Quit smoking right away, not slowly over a period of time.  When you start quitting, seek help from your doctor, family, or friends. This information is not intended to replace advice given to you by your health care provider. Make sure you discuss any questions you have with your health care provider. Document Released: 04/18/2009 Document Revised: 09/09/2018 Document Reviewed: 09/10/2018 Elsevier Patient Education  2020 [employer]  

## 2019-04-17 NOTE — Progress Notes (Signed)
Established Patient Office Visit  Subjective:  Patient ID: Melanie Weaver, female    DOB: [birth year]  Age: 78 y.o. MRN: [id number]  CC:  Chief Complaint  Patient presents with  . Diabetes  . Hypertension  . Hyperlipidemia    HPI [place] presents for follow-up of chronic medical issues including type 2 diabetes, hypertension, hyperlipidemia, COPD/emphysema and patient with pulmonary nodules and continued smoking.  Patient states that due to the COVID-19 pandemic and having to be at home all day, she is smoking about half a pack per day or 9 to 10 cigarettes daily.  She denies any increased shortness of breath or cough.  She reports that last year she had a pneumonia immunization and believes that she then developed pneumonia which required hospitalization.  She does not wish to have influenza immunization.  Patient is aware of findings of pulmonary nodules on CT scan done in January 2020 but was not aware of recommendation for further follow-up of right paratracheal lymphadenopathy.  Patient states that she did have follow-up with ear nose and throat after her hospitalization for pneumonia due to an abnormal study showing swelling in her parotid gland.        She reports that overall she feels well.  She reports that her home blood pressures are generally lower than the ones here in the office.  She states at home blood pressures are generally in the 130s over 80s.  She denies headaches or dizziness related to her blood pressure.  She reports that her home blood sugar was 107 this morning prior to eating.  Blood sugars are generally in the 120s or less fasting.  She tends to not get hungry until about noon time and often skips breakfast.  She does not take her metformin before going out in the mornings as otherwise her blood sugars will drop.  She denies increased thirst, no urinary frequency and no visual issues.  She has seen her ophthalmologist for yearly diabetic eye exam.  She denies any  numbness or tingling in her feet but occasionally feels as if her feet get very hot.  She does sometimes get sensation of her right foot getting very cold and wintertime.  She denies any leg pain or heaviness/leg fatigue with walking.  She denies any increased muscle or joint pain with the use of cholesterol medicine.  She does have issues with onset of low back pain if she has been standing for a while such as doing dishes.  She does sometimes have radiation of pain down her left leg.  This pain is usually sharp and burning and is about a 6-8 on a 0-to-10 scale.  She does not have this pain at today's visit.  Back pain and leg pain go away after she sits down to rest.  Past Medical History:  Diagnosis Date  . Essential hypertension   . Mixed hyperlipidemia   . Parotid mass   . Pulmonary nodule   . Type 2 diabetes mellitus without complication, without long-term current use of insulin [facility])     Past Surgical History:  Procedure Laterality Date  . ABDOMINAL HYSTERECTOMY      Family History  Problem Relation Age of Onset  . CVA Brother   . Diabetes Son   . Hypertension Son   . Mental retardation Son   . Stroke Son   . Diabetes Daughter     Social History   Socioeconomic History  . Marital status: Married    Spouse name: Not  on file  . Number of children: Not on file  . Years of education: Not on file  . Highest education level: Not on file  Occupational History  . Not on file  Social Needs  . Financial resource strain: Not on file  . Food insecurity    Worry: Not on file    Inability: Not on file  . Transportation needs    Medical: Not on file    Non-medical: Not on file  Tobacco Use  . Smoking status: Current Every Day Smoker    Packs/day: 0.50    Years: 40.00    Pack years: 20.00    Types: Cigarettes  . Smokeless tobacco: Never Used  . Tobacco comment:  1-2 cigs per day  Substance and Sexual Activity  . Alcohol use: No  . Drug use: No  . Sexual activity: Not on  file  Lifestyle  . Physical activity    Days per week: Not on file    Minutes per session: Not on file  . Stress: Not on file  Relationships  . Social Herbalist on phone: Not on file    Gets together: Not on file    Attends religious service: Not on file    Active member of club or organization: Not on file    Attends meetings of clubs or organizations: Not on file    Relationship status: Not on file  . Intimate partner violence    Fear of current or ex partner: Not on file    Emotionally abused: Not on file    Physically abused: Not on file    Forced sexual activity: Not on file  Other Topics Concern  . Not on file  Social History Narrative   Lives with son with who is mentally handicapped.    Outpatient Medications Prior to Visit  Medication Sig Dispense Refill  . amLODipine (NORVASC) 10 MG tablet Take 1 tablet (10 mg total) by mouth daily. 90 tablet 3  . aspirin EC 81 MG tablet Take 81 mg by mouth daily.    . irbesartan (AVAPRO) 300 MG tablet Take 1 tablet (300 mg total) by mouth daily. 90 tablet 0  . metFORMIN (GLUCOPHAGE) 500 MG tablet Take 1 tablet (500 mg total) by mouth 2 (two) times daily with a meal. 180 tablet 2  . metoprolol succinate (TOPROL-XL) 50 MG 24 hr tablet Take 1 tablet (50 mg total) by mouth daily. 90 tablet 0  . Multiple Vitamin (MULTIVITAMIN WITH MINERALS) TABS tablet Take 1 tablet by mouth daily.    . pravastatin (PRAVACHOL) 80 MG tablet Take 80 mg by mouth every evening.     Marland Kitchen spironolactone (ALDACTONE) 50 MG tablet Take 1 tablet (50 mg total) by mouth daily. 90 tablet 0   No facility-administered medications prior to visit.     Allergies  Allergen Reactions  . Amoxicillin Diarrhea    severe  . Maxzide [Hydrochlorothiazide W-Triamterene]     Dizziness    ROS Review of Systems  Constitutional: Positive for fatigue (occasional, mild-thinks she is doing well since she is 78).  HENT: Negative for sore throat and trouble swallowing.    Eyes: Negative for photophobia and visual disturbance.  Respiratory: Negative for cough and shortness of breath.   Cardiovascular: Negative for chest pain, palpitations and leg swelling.  Gastrointestinal: Negative for abdominal pain, blood in stool, constipation, diarrhea and nausea.  Endocrine: Negative for polydipsia, polyphagia and polyuria.  Genitourinary: Negative for dysuria and frequency.  Musculoskeletal: Positive for back pain. Negative for arthralgias and gait problem.  Neurological: Negative for dizziness, numbness and headaches.  Hematological: Negative for adenopathy. Does not bruise/bleed easily.      Objective:    Physical Exam  Constitutional: She is oriented to person, place, and time. She appears well-developed and well-nourished.  WNWD older female in NAD, she is accompanied by her son who is intellectually disabled  Neck: Normal range of motion. No JVD present. No thyromegaly present.  Cardiovascular: Normal rate and regular rhythm.  No carotid bruit appreciated on exam  Pulmonary/Chest: Effort normal and breath sounds normal.  Hyperresonant breath sounds and prolonged expiration  Abdominal: Soft. There is no abdominal tenderness. There is no rebound and no guarding.  Musculoskeletal:        General: No tenderness or edema.     Comments: No CVA tenderness  Lymphadenopathy:    She has no cervical adenopathy.  Neurological: She is alert and oriented to person, place, and time.  Skin: Skin is warm and dry.  Psychiatric: She has a normal mood and affect. Her behavior is normal.  Nursing note and vitals reviewed. Diabetic foot exam- no active skin breakdown on the feet.  Mild thickening of the right great toenail and some mild discoloration, patient is wearing nail polish which is not completely covering the toes but does somewhat impair evaluation.  Patient with decreased right dorsalis pedis and posterior tibial pulses which are poorly palpable.  No cyanosis and  normal capillary refill.  No active skin breakdown on the feet bilaterally.  Mild dry skin on the feet bilaterally.  1-2+ posterior tibial and dorsalis pedis pulses on the left.  Toenails are appropriately trimmed.  Normal monofilament exam bilaterally.  BP (!) 143/73   Pulse 74   Temp (!) 97.3 F (36.3 C) (Temporal)   Resp 17   Wt 185 lb (83.9 kg)   SpO2 95%   BMI 33.30 kg/m  Wt Readings from Last 3 Encounters:  04/17/19 185 lb (83.9 kg)  01/12/19 178 lb (80.7 kg)  12/01/18 178 lb 9.6 oz (81 kg)     Health Maintenance Due  Topic Date Due  . OPHTHALMOLOGY EXAM  11/06/2018  . INFLUENZA VACCINE  02/04/2019  . FOOT EXAM  03/02/2019  Diabetic foot exam was done at today's visit.  Patient declines influenza immunization.  Patient has had ophthalmology exam/diabetic eye exam earlier this year by Dr. Edwina Barth in chart  Lab Results  Component Value Date   TSH 1.500 12/01/2018   Lab Results  Component Value Date   WBC 7.5 05/09/2018   HGB 17.5 (H) 05/09/2018   HCT 56.9 (H) 05/09/2018   MCV 81.3 05/09/2018   PLT 131 (L) 05/09/2018   Lab Results  Component Value Date   NA 137 01/12/2019   K 5.1 01/12/2019   CO2 19 (L) 01/12/2019   GLUCOSE 125 (H) 01/12/2019   BUN 26 01/12/2019   CREATININE 1.05 (H) 01/12/2019   BILITOT 0.7 01/12/2019   ALKPHOS 103 01/12/2019   AST 18 01/12/2019   ALT 16 01/12/2019   PROT 7.0 01/12/2019   ALBUMIN 4.5 01/12/2019   CALCIUM 10.3 01/12/2019   ANIONGAP 10 05/09/2018   No results found for: CHOL No results found for: HDL No results found for: LDLCALC No results found for: TRIG No results found for: [facility] Lab Results  Component Value Date   HGBA1C 6.5 (H) 12/01/2018      Assessment & Plan:  1. Type 2  diabetes mellitus with other circulatory complication, without long-term current use of insulin ([place]); peripheral vascular disease She reports good control of her diabetes and her last hemoglobin A1c was good at 6.5 in May of  this year.  She will have repeat hemoglobin A1c as well as microalbumin/creatinine ratio, comprehensive metabolic panel and lipid panel at today's visit in follow-up of her diabetes.  Patient is encouraged to completely stop smoking to help reduce risk of cardiovascular disease associated with diabetes.  She will continue her current metformin and patient is encouraged to make sure that she eats prior to taking the medication to help avoid hypoglycemia.  Patient has had a diabetic eye exam by Dr. Gershon Crane earlier this year.  Diabetic foot exam done at today's visit.  Patient with poorly palpable pulses in the right foot and patient's right foot is slightly cooler than the left foot.  Discussed with patient that she likely has peripheral vascular disease.  She also has had evidence of vascular atherosclerosis on prior imaging. - Microalbumin/Creatinine Ratio, Urine - Comprehensive metabolic panel - Hemoglobin A1c - Lipid Panel  2. Essential hypertension She reports that her home blood pressures are better controlled and here in the office.  She is to continue the use of amlodipine, Avapro, metoprolol and spironolactone.  Electrolytes will be checked as part of CMP.  Patient will also have lipid panel. - Lipid Panel  3. LAD (lymphadenopathy), paratracheal Patient with paratracheal lymphadenopathy on CT scan done in January 2020.  Patient was not aware of recommendation for repeat of this test with contrast in 3 to 6 months.  Patient reports that she had prior work-up by ENT for abnormalities during hospitalization in the fall of last year.  Patient will be referred to ENT for further evaluation and treatment.  Smoking cessation encouraged. - Ambulatory referral to ENT  4. Hyperlipidemia associated with type 2 diabetes mellitus ([place]) She is currently on Pravachol 80 mg and will have repeat lipid panel at today's visit as well as repeat comprehensive metabolic panel in follow-up of her use of statin  medication.  Patient has evidence of coronary artery and aortic atherosclerosis on prior imaging/CT scans. - Comprehensive metabolic panel - Lipid Panel  5. Chronic obstructive pulmonary disease, unspecified COPD type [facility]) Patient with evidence of emphysema on CT scan done in January 2020 and patient is strongly encouraged to completely stop smoking. - CT CHEST NODULE FOLLOW UP LOW DOSE W/O; Future  6. Encounter for long-term (current) use of medications Patient will have CMP done in follow-up of long-term use of medications such as Pravachol for treatment of hyperlipidemia and metformin for treatment of diabetes in addition to multiple medications for hypertension - Comprehensive metabolic panel  7. Tobacco dependence 8. Pulmonary nodules Discussed and encourage smoking cessation and handout on smoking cessation given as part of after visit summary.  Patient has had prior CT scan showing pulmonary nodules 4 mm or less.  Due to her ongoing smoking, she will be scheduled for repeat CT scan and follow-up of pulmonary nodules for January 20 21 - CT CHEST NODULE FOLLOW UP LOW DOSE W/O; Future  9. Low back pain with radiation 10. Lumbar degenerative disc disease Discussed with patient that she had evidence of lumbar degenerative disc disease especially at L4-5 seen on prior CT scan of the abdomen and pelvis which was done in 2014.  Explained to patient that this can cause her to have the periodic low back pain with radiation down the left leg.  An After Visit Summary was printed and given to the patient.  Follow-up: No follow-ups on file.    Antony Blackbird, MD

## 2019-04-18 LAB — COMPREHENSIVE METABOLIC PANEL WITH GFR
ALT: 21 IU/L (ref 0–32)
AST: 18 IU/L (ref 0–40)
Albumin/Globulin Ratio: 1.8 (ref 1.2–2.2)
Albumin: 4.4 g/dL (ref 3.7–4.7)
Alkaline Phosphatase: 98 IU/L (ref 39–117)
BUN/Creatinine Ratio: 23 (ref 12–28)
BUN: 23 mg/dL (ref 8–27)
Bilirubin Total: 0.5 mg/dL (ref 0.0–1.2)
CO2: 21 mmol/L (ref 20–29)
Calcium: 10.3 mg/dL (ref 8.7–10.3)
Chloride: 101 mmol/L (ref 96–106)
Creatinine, Ser: 0.98 mg/dL (ref 0.57–1.00)
GFR calc Af Amer: 63 mL/min/1.73
GFR calc non Af Amer: 55 mL/min/1.73 — ABNORMAL LOW
Globulin, Total: 2.5 g/dL (ref 1.5–4.5)
Glucose: 123 mg/dL — ABNORMAL HIGH (ref 65–99)
Potassium: 5 mmol/L (ref 3.5–5.2)
Sodium: 137 mmol/L (ref 134–144)
Total Protein: 6.9 g/dL (ref 6.0–8.5)

## 2019-04-18 LAB — LIPID PANEL
Chol/HDL Ratio: 3 ratio (ref 0.0–4.4)
Cholesterol, Total: 166 mg/dL (ref 100–199)
HDL: 56 mg/dL
LDL Chol Calc (NIH): 95 mg/dL (ref 0–99)
Triglycerides: 78 mg/dL (ref 0–149)
VLDL Cholesterol Cal: 15 mg/dL (ref 5–40)

## 2019-04-18 LAB — HEMOGLOBIN A1C
Est. average glucose Bld gHb Est-mCnc: 148 mg/dL
Hgb A1c MFr Bld: 6.8 % — ABNORMAL HIGH (ref 4.8–5.6)

## 2019-04-18 LAB — MICROALBUMIN / CREATININE URINE RATIO
Creatinine, Urine: 99.3 mg/dL
Microalb/Creat Ratio: 7 mg/g{creat} (ref 0–29)
Microalbumin, Urine: 6.6 ug/mL

## 2019-04-27 NOTE — Progress Notes (Signed)
Normal lab letter mailed.

## 2019-05-08 ENCOUNTER — Other Ambulatory Visit: Payer: Self-pay

## 2019-05-08 MED ORDER — SPIRONOLACTONE 50 MG PO TABS: 50.0000 mg | ORAL_TABLET | Freq: Every day | ORAL | 0 refills | Status: DC

## 2019-05-29 ENCOUNTER — Other Ambulatory Visit: Payer: Self-pay

## 2019-05-29 MED ORDER — METOPROLOL SUCCINATE ER 50 MG PO TB24: 50.0000 mg | ORAL_TABLET | Freq: Every day | ORAL | 0 refills | Status: DC

## 2019-05-29 MED ORDER — METFORMIN HCL 500 MG PO TABS: 500.0000 mg | ORAL_TABLET | Freq: Two times a day (BID) | ORAL | 0 refills | Status: DC

## 2019-05-29 MED ORDER — IRBESARTAN 300 MG PO TABS: 300.0000 mg | ORAL_TABLET | Freq: Every day | ORAL | 0 refills | Status: DC

## 2019-06-03 ENCOUNTER — Other Ambulatory Visit: Payer: Self-pay | Admitting: Family Medicine

## 2019-08-11 ENCOUNTER — Other Ambulatory Visit: Payer: Self-pay

## 2019-08-11 MED ORDER — SPIRONOLACTONE 50 MG PO TABS: 50.0000 mg | ORAL_TABLET | Freq: Every day | ORAL | 0 refills | Status: DC

## 2019-08-21 ENCOUNTER — Ambulatory Visit: Payer: Medicare Other | Admitting: Internal Medicine

## 2019-08-22 ENCOUNTER — Encounter: Payer: Self-pay | Admitting: Internal Medicine

## 2019-08-22 ENCOUNTER — Ambulatory Visit (INDEPENDENT_AMBULATORY_CARE_PROVIDER_SITE_OTHER): Payer: Medicare Other | Admitting: Internal Medicine

## 2019-08-22 DIAGNOSIS — I10 Essential (primary) hypertension: Secondary | ICD-10-CM

## 2019-08-22 DIAGNOSIS — E119 Type 2 diabetes mellitus without complications: Secondary | ICD-10-CM

## 2019-08-22 NOTE — Progress Notes (Signed)
Virtual Visit via Telephone Note  I connected with Darsha Luiz, on 08/22/2019 at 10:17 AM by telephone due to the COVID-19 pandemic and verified that I am speaking with the correct person using two identifiers.   Consent: I discussed the limitations, risks, security and privacy concerns of performing an evaluation and management service by telephone and the availability of in person appointments. I also discussed with the patient that there may be a patient responsible charge related to this service. The patient expressed understanding and agreed to proceed.   Location of Patient: Home   Location of Provider: Clinic    Persons participating in Telemedicine visit: [place] Egy [facility] Dr. Juleen China      History of Present Illness: Patient has a visit to follow up on chronic medical problems. No concerns today.   Chronic HTN Disease Monitoring:  Home BP Monitoring - Not recently. Reports she was previously getting numbers 0000000 systolic before she stopped checking.  Chest pain- no  Dyspnea- no Headache - no  Medications: Amlodipine 10 mg, Irbesartan 300 mg, Metoprolol 50 mg, Spironolactone 50 mg  Compliance- yes Lightheadedness- no  Edema- no    Diabetes mellitus, Type 2 Disease Monitoring             Blood Sugar Ranges: Fasting - 73, 101, 123              Polyuria: no              Visual problems: no   Urine Microalbumin 7 in Oct 2020  Last A1C: 6.8%   Medications: Metformin 500 mg BID  Medication Compliance: no  Medication Side Effects             Hypoglycemia: no   Preventitive Health Care             Eye Exam: UTD             Foot Exam: UTD      Past Medical History:  Diagnosis Date  . Essential hypertension   . Mixed hyperlipidemia   . Parotid mass   . Pulmonary nodule   . Type 2 diabetes mellitus without complication, without long-term current use of insulin (HCC)    Allergies  Allergen Reactions  . Amoxicillin  Diarrhea    severe  . Maxzide [Hydrochlorothiazide W-Triamterene]     Dizziness    Current Outpatient Medications on File Prior to Visit  Medication Sig Dispense Refill  . amLODipine (NORVASC) 10 MG tablet Take 1 tablet (10 mg total) by mouth daily. 90 tablet 3  . aspirin EC 81 MG tablet Take 81 mg by mouth daily.    . irbesartan (AVAPRO) 300 MG tablet Take 1 tablet (300 mg total) by mouth daily. 90 tablet 0  . metFORMIN (GLUCOPHAGE) 500 MG tablet Take 1 tablet (500 mg total) by mouth 2 (two) times daily with a meal. 180 tablet 0  . metoprolol succinate (TOPROL-XL) 50 MG 24 hr tablet Take 1 tablet (50 mg total) by mouth daily. 90 tablet 0  . Multiple Vitamin (MULTIVITAMIN WITH MINERALS) TABS tablet Take 1 tablet by mouth daily.    . pravastatin (PRAVACHOL) 80 MG tablet Take 80 mg by mouth every evening.     Marland Kitchen spironolactone (ALDACTONE) 50 MG tablet Take 1 tablet (50 mg total) by mouth daily. 90 tablet 0   No current facility-administered medications on file prior to visit.    Observations/Objective: NAD. Speaking clearly.  Work of breathing normal.  Alert and oriented.  Mood appropriate.   Assessment and Plan: 1. Essential hypertension BP seems to be at goal mostly at home. Encouraged more routine monitoring. Continue current therapy.  Counseled on blood pressure goal of less than 130/80, low-sodium, DASH diet, medication compliance, 150 minutes of moderate intensity exercise per week. Discussed medication compliance, adverse effects.  2. Type 2 diabetes mellitus without complication, without long-term current use of insulin ([place]) Reviewed prior A1c of 6.5 and 6.8. Given age, she is actually on the lower side of goal A1c. We have elected to wait another 3 months to check given good glucose control, no concerning symptoms of hyperglycemia, and patient wishes to limit leaving house when possible given COVID. If A1c is still <7 in 3 months, would recommend discontinuing Metformin.      Follow Up Instructions: 3 months for chronic medical problems    I discussed the assessment and treatment plan with the patient. The patient was provided an opportunity to ask questions and all were answered. The patient agreed with the plan and demonstrated an understanding of the instructions.   The patient was advised to call back or seek an in-person evaluation if the symptoms worsen or if the condition fails to improve as anticipated.     I provided 14 minutes total of non-face-to-face time during this encounter including median intraservice time, reviewing previous notes, investigations, ordering medications, medical decision making, coordinating care and patient verbalized understanding at the end of the visit.    Phill Myron, D.O. Primary Care at [facility]  08/22/2019, 10:17 AM

## 2019-09-07 ENCOUNTER — Other Ambulatory Visit: Payer: Self-pay | Admitting: Cardiology

## 2019-09-11 ENCOUNTER — Other Ambulatory Visit: Payer: Self-pay

## 2019-09-11 MED ORDER — METOPROLOL SUCCINATE ER 50 MG PO TB24: 50.0000 mg | ORAL_TABLET | Freq: Every day | ORAL | 0 refills | Status: DC

## 2019-09-11 MED ORDER — IRBESARTAN 300 MG PO TABS: 300.0000 mg | ORAL_TABLET | Freq: Every day | ORAL | 0 refills | Status: DC

## 2019-09-11 MED ORDER — METFORMIN HCL 500 MG PO TABS: 500.0000 mg | ORAL_TABLET | Freq: Two times a day (BID) | ORAL | 0 refills | Status: DC

## 2019-11-03 ENCOUNTER — Other Ambulatory Visit: Payer: Self-pay | Admitting: Internal Medicine

## 2019-11-03 NOTE — Telephone Encounter (Signed)
1) Medication(s) Requested (by name):pravastatin (PRAVACHOL) 80 MG tablet [id number]    2) [facility] [place] [place],   3) Special Requests:   Approved medications will be sent to the pharmacy, we will reach out if there is an issue.  Requests made after 3pm may not be addressed until the following business day!  If a patient is unsure of the name of the medication(s) please note and ask patient to call back when they are able to provide all info, do not send to responsible party until all information is available!

## 2019-11-06 MED ORDER — PRAVASTATIN SODIUM 80 MG PO TABS: 80.0000 mg | ORAL_TABLET | Freq: Every evening | ORAL | 1 refills | Status: DC

## 2019-11-06 NOTE — Telephone Encounter (Signed)
Refills sent

## 2019-11-10 ENCOUNTER — Other Ambulatory Visit: Payer: Self-pay

## 2019-11-10 MED ORDER — SPIRONOLACTONE 50 MG PO TABS: 50.0000 mg | ORAL_TABLET | Freq: Every day | ORAL | 0 refills | Status: DC

## 2019-11-24 ENCOUNTER — Telehealth: Payer: Self-pay

## 2019-11-24 NOTE — Telephone Encounter (Signed)
Called patient to do their pre-visit COVID screening.  Call went to voicemail. Unable to do prescreening.  

## 2019-11-27 ENCOUNTER — Encounter: Payer: Self-pay | Admitting: Internal Medicine

## 2019-11-27 ENCOUNTER — Ambulatory Visit (INDEPENDENT_AMBULATORY_CARE_PROVIDER_SITE_OTHER): Payer: Medicare Other | Admitting: Internal Medicine

## 2019-11-27 ENCOUNTER — Other Ambulatory Visit: Payer: Self-pay

## 2019-11-27 VITALS — BP 147/73 | HR 60 | Temp 97.3°F | Resp 17 | Ht 62.5 in | Wt 184.0 lb

## 2019-11-27 DIAGNOSIS — E1159 Type 2 diabetes mellitus with other circulatory complications: Secondary | ICD-10-CM | POA: Diagnosis not present

## 2019-11-27 DIAGNOSIS — I10 Essential (primary) hypertension: Secondary | ICD-10-CM | POA: Diagnosis not present

## 2019-11-27 DIAGNOSIS — E1169 Type 2 diabetes mellitus with other specified complication: Secondary | ICD-10-CM

## 2019-11-27 DIAGNOSIS — E785 Hyperlipidemia, unspecified: Secondary | ICD-10-CM | POA: Diagnosis not present

## 2019-11-27 LAB — POCT GLYCOSYLATED HEMOGLOBIN (HGB A1C): Hemoglobin A1C: 6.9 % — AB (ref 4.0–5.6)

## 2019-11-27 LAB — GLUCOSE, POCT (MANUAL RESULT ENTRY): POC Glucose: 131 mg/dl — AB (ref 70–99)

## 2019-11-27 NOTE — Progress Notes (Signed)
Subjective:    Melanie Weaver - 79 y.o. female MRN [id number]  Date of birth: 1940/09/30  HPI  Melanie Weaver is here for follow up of chronic medical conditions.  Diabetes mellitus, Type 2 Disease Monitoring             Blood Sugar Ranges: Fasting - averages in 110s              Polyuria: no              Visual problems: no   Urine Microalbumin 7 (Oct 2020)   Last A1C: 6.8 (May 2020)   Medications: Metformin 500 mg BID  Medication Compliance: no, she only takes it once per day due to concern about hypoglycemia   Medication Side Effects             Hypoglycemia: no   Chronic HTN Disease Monitoring:  Home BP Monitoring - 130/60s average at home  Chest pain- no  Dyspnea- no Headache - no  Medications: Amlodipine 10 mg, Metoprolol 50 mg daily, Irbesartan 300 mg, Spironolactone 50 mg  Compliance- yes Lightheadedness- no  Edema- no   Health Maintenance:  Health Maintenance Due  Topic Date Due  . COVID-19 Vaccine (1) Never done  . HEMOGLOBIN A1C  10/16/2019    -  reports that she has been smoking cigarettes. She has a 20.00 pack-year smoking history. She has never used smokeless tobacco. - Review of Systems: Per HPI. - Past Medical History: Patient Active Problem List   Diagnosis Date Noted  . 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 Multiple lung nodules 09/28/2018  . LVH (left ventricular hypertrophy) 08/16/2018  . Syncope 05/05/2018  . HTN (hypertension) 05/05/2018  . DM2 (diabetes mellitus, type 2) ([place]) 05/05/2018  . Centrilobular emphysema ([place]) 05/05/2018   - Medications: reviewed and updated   Objective:   Physical Exam BP (!) 147/73   Pulse 60   Temp (!) 97.3 F (36.3 C) (Temporal)   Resp 17   Ht 5' 2.5" (1.588 m)   Wt 184 lb (83.5 kg)   SpO2 94%   BMI 33.12 kg/m  Physical Exam  Constitutional: She is oriented to person, place, and time and well-developed, well-nourished, and in no distress. No distress.  HENT:  Head:  Normocephalic and atraumatic.  Eyes: Conjunctivae and EOM are normal.  Cardiovascular: Normal rate, regular rhythm and normal heart sounds.  No murmur heard. Pulmonary/Chest: Effort normal and breath sounds normal. No respiratory distress.  Musculoskeletal:        General: Normal range of motion.  Neurological: She is alert and oriented to person, place, and time.  Skin: Skin is warm and dry. She is not diaphoretic.  Psychiatric: Affect and judgment normal.           Assessment & Plan:   1. Type 2 diabetes mellitus with other circulatory complication, without long-term current use of insulin (HCC) CBG 131 and A1c 6.9%. Patient has decreased Metformin to once a day because prior to that she was having fasting CBGs in the 80s. Recommended that we d/c Metformin completely given A1c goal of between 7-8% for age range and well controlled fasting CBGs. Continue to monitor glucose at home with d/c of medication and given parameters to alert me for hyperglycemia. Otherwise, plan to follow up in 3 months for repeat A1c.  - Glucose (CBG) - HgB A1c  2. Essential hypertension BP is above goal; however, sounds more controlled at home. Limited on medication adjustment options as she is  maxed out on Amlodipine and Irbesartan doses. Would not increase Beta Blocker dose as pulse was 60 today. Would be hesitant to increase Spironolactone dose above 50 mg as was prescribed for essential HTN. Patient was on HCTZ in the past but this was discontinued due to extreme dizziness. If remains elevated, would likely have return to HTN clinic with heart care for further management.   3. Hyperlipidemia associated with type 2 diabetes mellitus (HCC) Continue Pravastatin and ASA.      Phill Myron, D.O. 11/27/2019, 10:13 AM Primary Care at [facility]

## 2019-12-03 IMAGING — CT CT CHEST W/O CM
2 of 3 series · 15 of 36 positions shown, 18 images · non-contrast
Comparison: 05/05/2018 chest radiograph.

CLINICAL DATA: Follow-up pulmonary nodule.

EXAM:
CT CHEST WITHOUT CONTRAST
TECHNIQUE: Multidetector CT imaging of the chest was performed following the
standard protocol without IV contrast.

[Series 2: thorax · axial · 0.66mm/px · z∈[+1208,+1428]mm · 12 of 130 slices shown, 15 images]
[im 10/130  mediastinal]
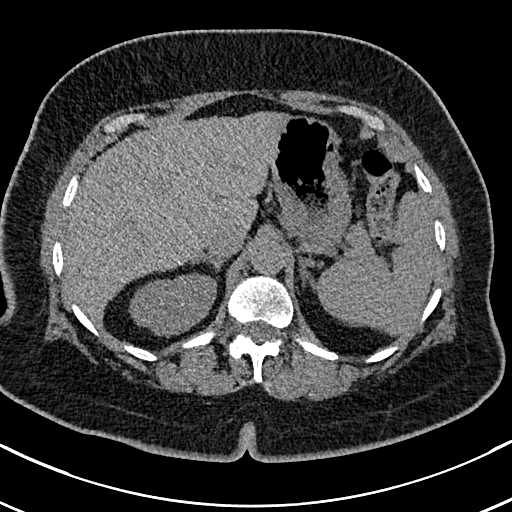
[im 10/130  lung]
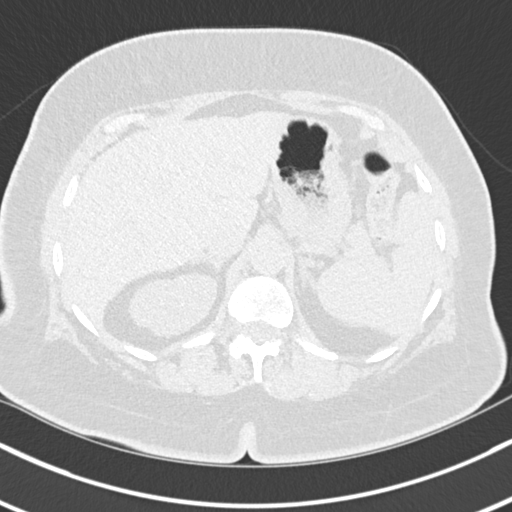
[im 20/130  lung]
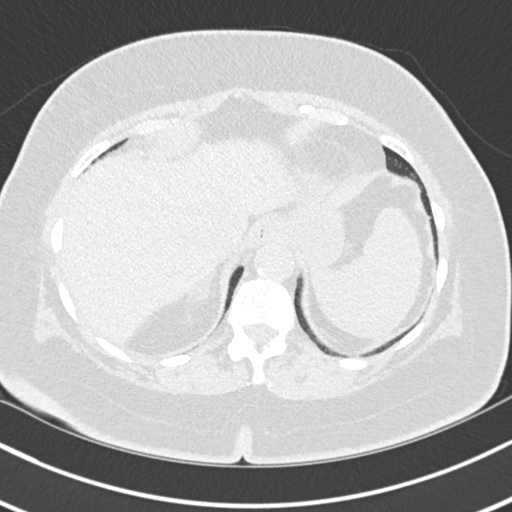
[im 29/130  lung]
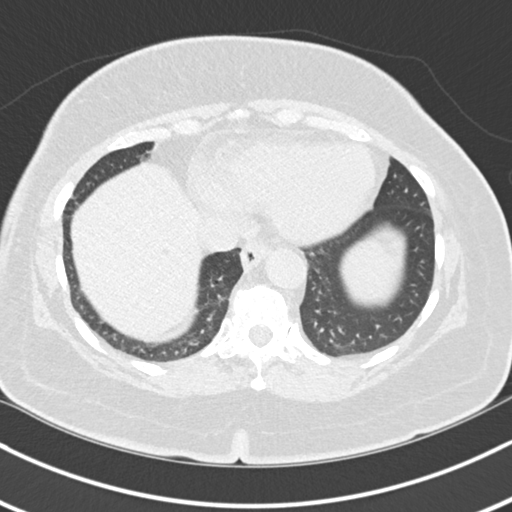
[im 39/130  lung]
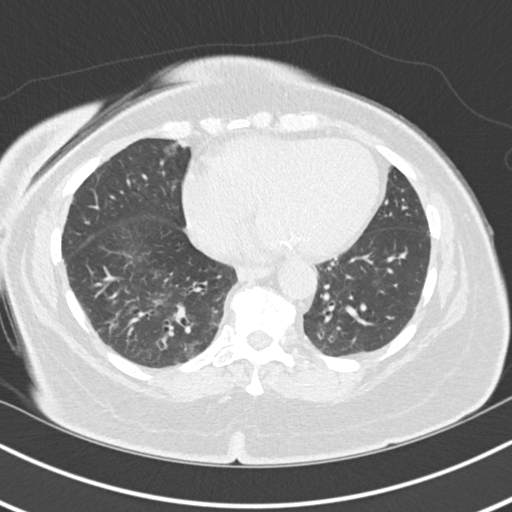
[im 48/130  mediastinal]
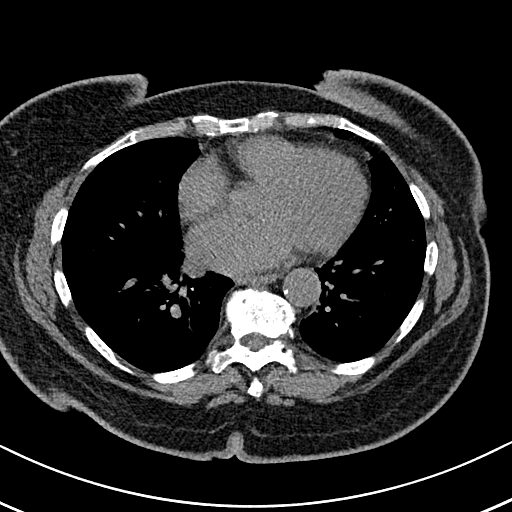
[im 48/130  lung]
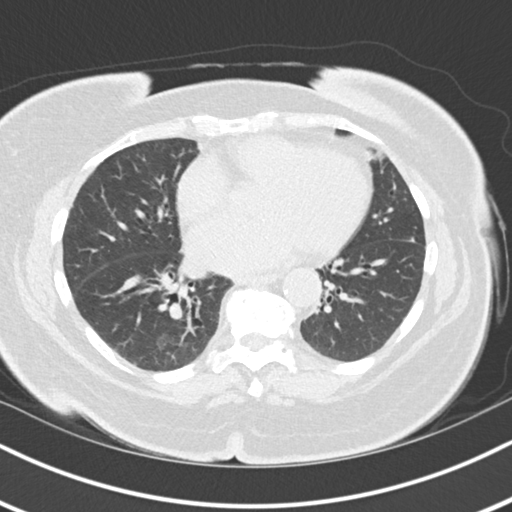
[im 58/130  lung]
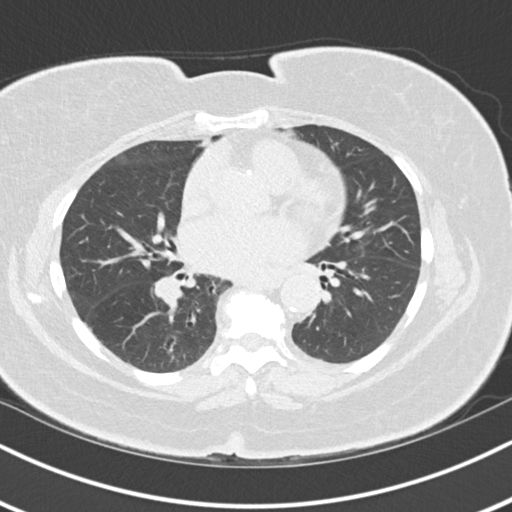
[im 72/130  lung]
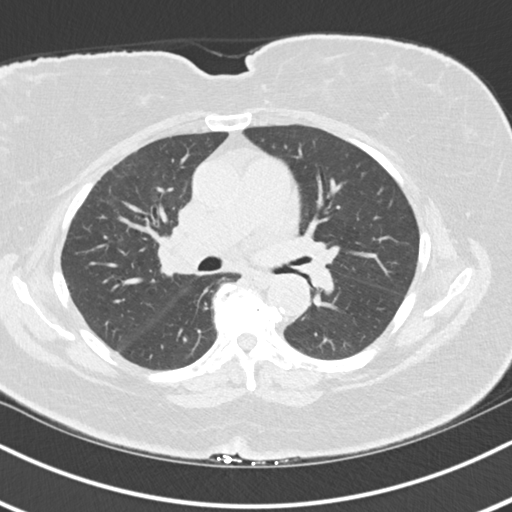
[im 82/130  lung]
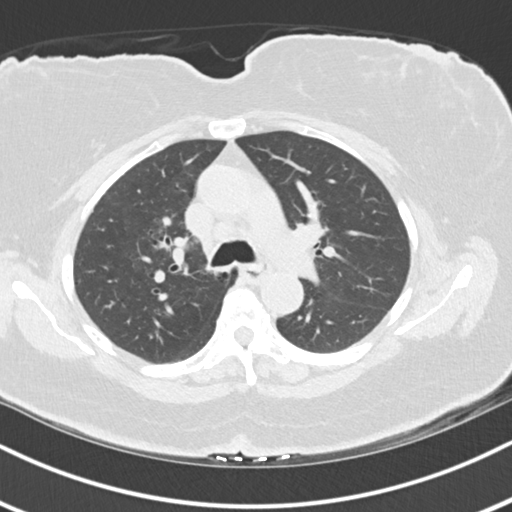
[im 91/130  mediastinal]
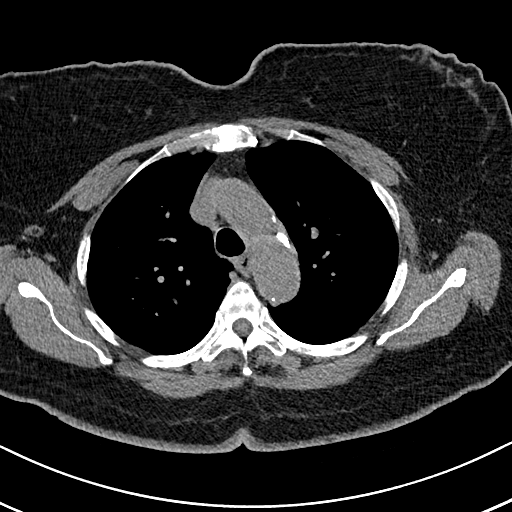
[im 91/130  lung]
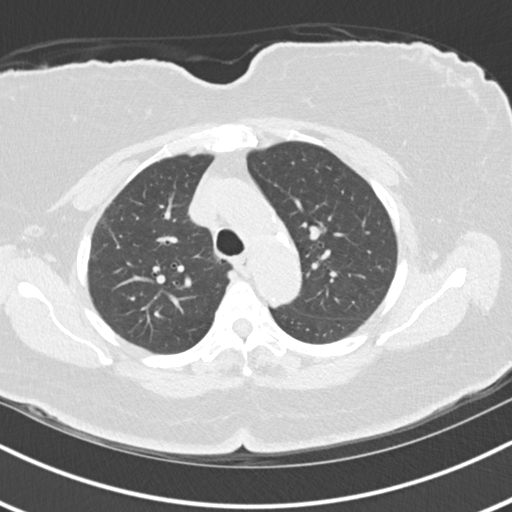
[im 101/130  lung]
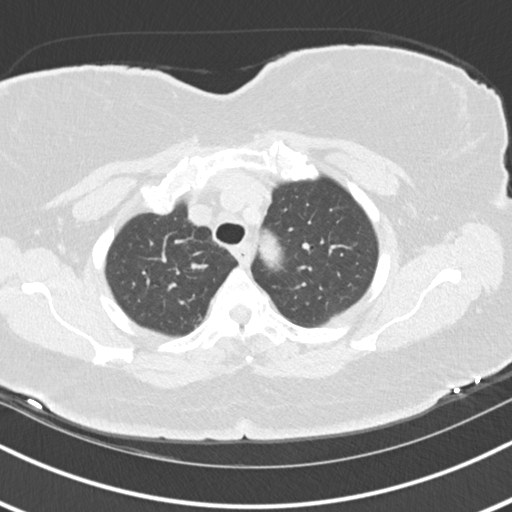
[im 110/130  lung]
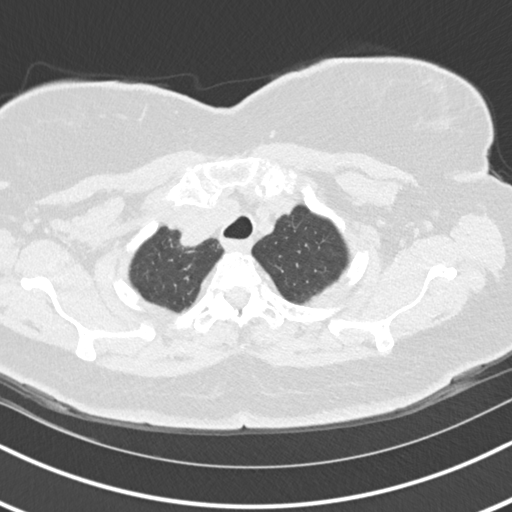
[im 120/130  lung]
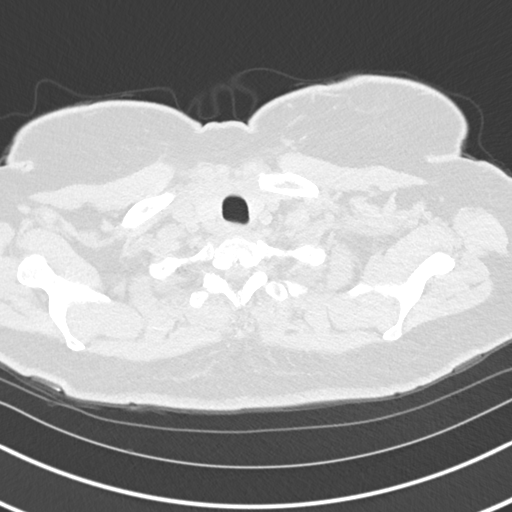

[Series 5: coronal · coronal · 0.52mm/px · 3 of 151 slices shown]
[im 31/151  lung]
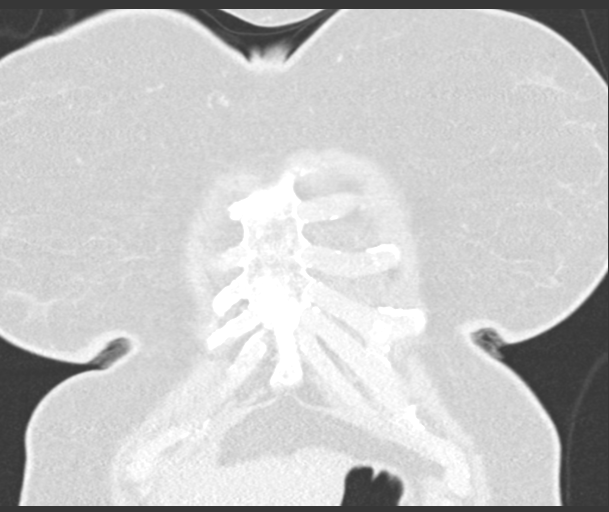
[im 61/151  lung]
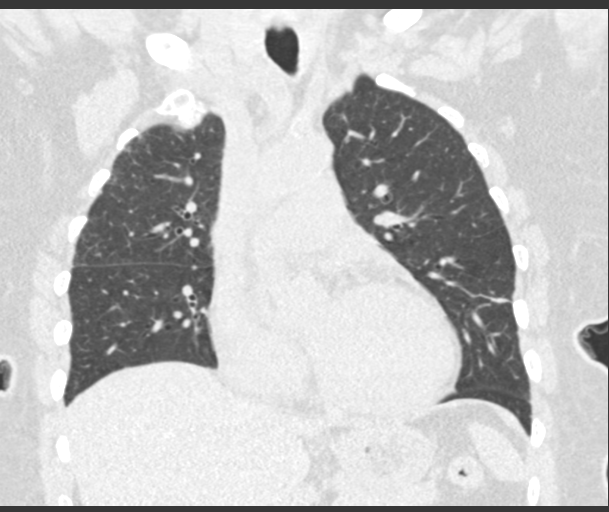
[im 91/151  lung]
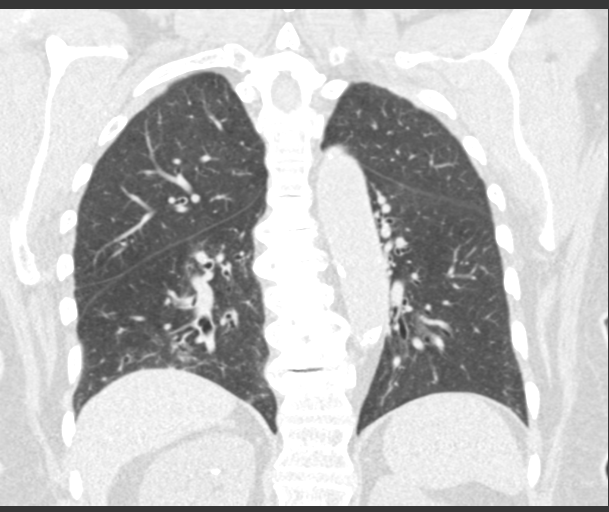

[15 of 36 positions shown; findings below may reference images not displayed]

FINDINGS: Cardiovascular: Top-normal heart size. No significant pericardial
effusion/thickening. Left anterior descending and right coronary
atherosclerosis. Atherosclerotic nonaneurysmal thoracic aorta.
Top-normal main pulmonary artery (3.1 cm diameter).

Mediastinum/Nodes: No discrete thyroid nodules. Unremarkable
esophagus. No axillary adenopathy. Mildly enlarged 1.1 cm right
lower paratracheal node (series 2/image 50). No additional
pathologically enlarged mediastinal nodes. No discrete hilar
adenopathy on this noncontrast scan.

Lungs/Pleura: No pneumothorax. No pleural effusion. No acute
consolidative airspace disease or lung masses. Right middle lobe 4
mm solid pulmonary nodule (series 3/image 93), new since 04/11/2013
CT abdomen study. Left lower lobe 3 mm solid pulmonary nodule
(series 3/image 78), for which no prior comparison CT exists. There
is mild centrilobular and paraseptal emphysema with mild diffuse
bronchial wall thickening. There is mild-to-moderate varicoid
bronchiectasis throughout the right lower lobe, with lesser
involvement of the right middle lobe, lingula and medial left lower
lobe.

Upper abdomen: Subcentimeter hypodense liver dome lesions are too
small to characterize and are probably stable since 04/11/2013 CT,
probably benign. Nonobstructing 5 mm upper right renal stone.

Musculoskeletal: No aggressive appearing focal osseous lesions.
Marked thoracic spondylosis.
IMPRESSION: 1. Two scattered small solid pulmonary nodules, largest 4 mm. No
follow-up needed if patient is low-risk (and has no known or
suspected primary neoplasm). Non-contrast chest CT can be considered
in 12 months if patient is high-risk. This recommendation follows
the consensus statement: Guidelines for Management of Incidental
Pulmonary Nodules Detected on CT Images:From the [HOSPITAL]
1789; published online before print (10.1148/radiol.4431303003).
2. Mild-to-moderate varicoid bronchiectasis at both lung bases, most
prominent in the right lower lobe.
3. Mild centrilobular and paraseptal emphysema with mild diffuse
bronchial wall thickening, suggesting COPD.
4. Nonspecific mild right paratracheal lymphadenopathy. Suggest
attention on follow-up chest CT with IV contrast in 3-6 months given
the smoking related changes in the lungs.
5. Two vessel coronary atherosclerosis.
6. Nonobstructing right nephrolithiasis.

Aortic Atherosclerosis (SY55B-TR7.7) and Emphysema (SY55B-C1V.H).

## 2019-12-07 ENCOUNTER — Other Ambulatory Visit: Payer: Self-pay

## 2019-12-07 ENCOUNTER — Other Ambulatory Visit: Payer: Self-pay | Admitting: Cardiology

## 2019-12-07 MED ORDER — IRBESARTAN 300 MG PO TABS: 300.0000 mg | ORAL_TABLET | Freq: Every day | ORAL | 1 refills | Status: DC

## 2019-12-07 MED ORDER — METOPROLOL SUCCINATE ER 50 MG PO TB24: 50.0000 mg | ORAL_TABLET | Freq: Every day | ORAL | 1 refills | Status: DC

## 2019-12-18 ENCOUNTER — Ambulatory Visit: Payer: Medicare Other | Attending: Internal Medicine

## 2019-12-18 DIAGNOSIS — Z23 Encounter for immunization: Secondary | ICD-10-CM

## 2019-12-18 NOTE — Progress Notes (Signed)
   Covid-19 Vaccination Clinic  Name:  Melanie Weaver    MRN: [id number] DOB: 01/03/1941  12/18/2019  [name] was observed post Covid-19 immunization for 15 minutes without incident. She was provided with Vaccine Information Sheet and instruction to access the V-Safe system.   [name] was instructed to call 911 with any severe reactions post vaccine: Marland Kitchen Difficulty breathing  . Swelling of face and throat  . A fast heartbeat  . A bad rash all over body  . Dizziness and weakness   Immunizations Administered    Name Date Dose VIS Date Route   Pfizer COVID-19 Vaccine 12/18/2019 10:50 AM 0.3 mL 08/30/2018 Intramuscular   Manufacturer: [place]   Lot: [id number]   [place]: [id number]

## 2020-01-22 LAB — HM DIABETES EYE EXAM

## 2020-02-09 ENCOUNTER — Other Ambulatory Visit: Payer: Self-pay

## 2020-02-09 MED ORDER — SPIRONOLACTONE 50 MG PO TABS: 50.0000 mg | ORAL_TABLET | Freq: Every day | ORAL | 0 refills | Status: DC

## 2020-02-27 ENCOUNTER — Ambulatory Visit: Payer: Medicare Other | Admitting: Internal Medicine

## 2020-02-28 ENCOUNTER — Ambulatory Visit: Payer: Medicare Other | Admitting: Internal Medicine

## 2020-03-05 ENCOUNTER — Encounter (INDEPENDENT_AMBULATORY_CARE_PROVIDER_SITE_OTHER): Payer: Medicare Other | Admitting: Internal Medicine

## 2020-03-06 ENCOUNTER — Other Ambulatory Visit: Payer: Self-pay | Admitting: Cardiology

## 2020-03-06 NOTE — Progress Notes (Signed)
Opened in error. Patient did not answer phone x3 for televisit.   Phill Myron, D.O. Primary Care at [facility]  03/06/2020, 10:15 AM

## 2020-04-07 ENCOUNTER — Other Ambulatory Visit: Payer: Self-pay | Admitting: Cardiology

## 2020-04-17 ENCOUNTER — Other Ambulatory Visit: Payer: Self-pay

## 2020-04-17 ENCOUNTER — Ambulatory Visit
Admission: EM | Admit: 2020-04-17 | Discharge: 2020-04-17 | Disposition: A | Discharge status: Home / Self Care | Payer: Medicare Other | Attending: Family Medicine | Admitting: Family Medicine

## 2020-04-17 DIAGNOSIS — L03116 Cellulitis of left lower limb: Secondary | ICD-10-CM | POA: Diagnosis not present

## 2020-04-17 MED ORDER — CEPHALEXIN 500 MG PO CAPS: 500.0000 mg | ORAL_CAPSULE | Freq: Two times a day (BID) | ORAL | 0 refills | Status: DC

## 2020-04-17 NOTE — ED Triage Notes (Signed)
PT states she started developing the wound on her foot on Friday and it has gotten increasingly worse. Pt now has swelling and pain in the left foot. Pt is aox4 but ambulates slowly/withdifficulty. Unsure of pt baseline but she states the wound is causing her difficulty ambulating.

## 2020-04-17 NOTE — Discharge Instructions (Addendum)
Have primary care physician refer you to orthopedic doctor for bunion care If skin breaks down also need referral to wound clinic

## 2020-04-17 NOTE — ED Provider Notes (Signed)
[facility]    CSN: [id number] Arrival date & time: 04/17/20  1257      History   Chief Complaint Chief Complaint  Patient presents with  . Foot Pain    since friday    HPI Melanie Weaver is a 79 y.o. female.   Patient presents with pain on the dorsum of her left foot for the past 4 days.  She has a bunion on that foot.  She has past history of diabetes but her most recent A1c is 6.9 and she has been told she does not have diabetes.  I suspect that there is some still problem with metabolism of sugar.  HPI  Past Medical History:  Diagnosis Date  . Essential hypertension   . Mixed hyperlipidemia   . Parotid mass   . Pulmonary nodule   . Type 2 diabetes mellitus without complication, without long-term current use of insulin [facility])     Patient Active Problem List   Diagnosis Date Noted  . 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 Multiple lung nodules 09/28/2018  . LVH (left ventricular hypertrophy) 08/16/2018  . Syncope 05/05/2018  . HTN (hypertension) 05/05/2018  . DM2 (diabetes mellitus, type 2) ([place]) 05/05/2018  . Centrilobular emphysema ([place]) 05/05/2018    Past Surgical History:  Procedure Laterality Date  . ABDOMINAL HYSTERECTOMY      OB History   No obstetric history on file.      Home Medications    Prior to Admission medications   Medication Sig Start Date End Date Taking? Authorizing Provider  amLODipine (NORVASC) 10 MG tablet TAKE 1 TABLET(10 MG) BY MOUTH DAILY 04/08/20  Yes Minus Breeding, MD  aspirin EC 81 MG tablet Take 81 mg by mouth daily.   Yes [provider]  metoprolol succinate (TOPROL-XL) 50 MG 24 hr tablet Take 1 tablet (50 mg total) by mouth daily. 12/07/19  Yes Nicolette Bang, DO  spironolactone (ALDACTONE) 50 MG tablet Take 1 tablet (50 mg total) by mouth daily. 02/09/20  Yes Nicolette Bang, DO  irbesartan (AVAPRO) 300 MG tablet Take 1 tablet (300 mg total) by mouth daily. 12/07/19   Nicolette Bang, DO  Multiple Vitamin (MULTIVITAMIN WITH MINERALS) TABS tablet Take 1 tablet by mouth daily.    [provider]  pravastatin (PRAVACHOL) 80 MG tablet Take 1 tablet (80 mg total) by mouth every evening. 11/06/19   Nicolette Bang, DO    Family History Family History  Problem Relation Age of Onset  . CVA Brother   . Diabetes Son   . Hypertension Son   . Mental retardation Son   . Stroke Son   . Diabetes Daughter     Social History Social History   Tobacco Use  . Smoking status: Current Every Day Smoker    Packs/day: 0.50    Years: 40.00    Pack years: 20.00    Types: Cigarettes  . Smokeless tobacco: Never Used  . Tobacco comment:  1-2 cigs per day  Vaping Use  . Vaping Use: Never used  Substance Use Topics  . Alcohol use: No  . Drug use: No     Allergies   Amoxicillin and Maxzide [hydrochlorothiazide w-triamterene]   Review of Systems Review of Systems  Cardiovascular: Positive for leg swelling.  Skin: Positive for color change and rash.  All other systems reviewed and are negative.    Physical Exam Triage Vital Signs ED Triage Vitals  Enc Vitals Group  BP 04/17/20 1305 (!) 151/76     Pulse Rate 04/17/20 1305 68     Resp 04/17/20 1305 19     Temp 04/17/20 1305 98.3 F (36.8 C)     Temp Source 04/17/20 1305 Oral     SpO2 04/17/20 1305 98 %     Weight --      Height --      Head Circumference --      Peak Flow --      Pain Score 04/17/20 1307 6     Pain Loc --      Pain Edu? --      Excl. in [place]? --    No data found.  Updated Vital Signs BP (!) 151/76 (BP Location: Left Arm)   Pulse 68   Temp 98.3 F (36.8 C) (Oral)   Resp 19   SpO2 98%   Visual Acuity Right Eye Distance:   Left Eye Distance:   Bilateral Distance:    Right Eye Near:   Left Eye Near:    Bilateral Near:     Physical Exam Nursing note reviewed.  Constitutional:      Appearance: Normal appearance. She is obese.  Cardiovascular:      Rate and Rhythm: Normal rate.  Pulmonary:     Effort: Pulmonary effort is normal.  Skin:    Comments: Left foot: There is erythema and increased temp on the dorsum of the left foot beginning at a bunion over the first metatarsal.  This would appear to be cellulitis.  There is no breakdown of the skin at this time. Pulses are not palpable.  Except for the increased temperature related to cellulitis or there is no other coolness in the foot.  Neurological:     Mental Status: She is alert and oriented to person, place, and time.      UC Treatments / Results  Labs (all labs ordered are listed, but only abnormal results are displayed) Labs Reviewed - No data to display  EKG   Radiology No results found.  Procedures Procedures (including critical care time)  Medications Ordered in UC Medications - No data to display  Initial Impression / Assessment and Plan / UC Course  I have reviewed the triage vital signs and the nursing notes.  Pertinent labs & imaging results that were available during my care of the patient were reviewed by me and considered in my medical decision making (see chart for details).     Cellulitis left foot Final Clinical Impressions(s) / UC Diagnoses   Final diagnoses:  None   Discharge Instructions   None    ED Prescriptions    None     PDMP not reviewed this encounter.   Wardell Honour, MD 04/17/20 1326

## 2020-04-18 ENCOUNTER — Encounter: Payer: Self-pay | Admitting: Internal Medicine

## 2020-04-18 ENCOUNTER — Telehealth (INDEPENDENT_AMBULATORY_CARE_PROVIDER_SITE_OTHER): Payer: Medicare Other | Admitting: Internal Medicine

## 2020-04-18 DIAGNOSIS — E1169 Type 2 diabetes mellitus with other specified complication: Secondary | ICD-10-CM

## 2020-04-18 DIAGNOSIS — I10 Essential (primary) hypertension: Secondary | ICD-10-CM | POA: Diagnosis not present

## 2020-04-18 DIAGNOSIS — E1159 Type 2 diabetes mellitus with other circulatory complications: Secondary | ICD-10-CM | POA: Diagnosis not present

## 2020-04-18 DIAGNOSIS — M21612 Bunion of left foot: Secondary | ICD-10-CM

## 2020-04-18 DIAGNOSIS — L03116 Cellulitis of left lower limb: Secondary | ICD-10-CM

## 2020-04-18 DIAGNOSIS — E785 Hyperlipidemia, unspecified: Secondary | ICD-10-CM

## 2020-04-18 MED ORDER — SPIRONOLACTONE 50 MG PO TABS: 50.0000 mg | ORAL_TABLET | Freq: Every day | ORAL | 1 refills | Status: DC

## 2020-04-18 NOTE — Progress Notes (Signed)
Virtual Visit via Telephone Note  I connected with Melanie Weaver, on 04/18/2020 at 2:12 PM by telephone due to the COVID-19 pandemic and verified that I am speaking with the correct person using two identifiers.   Consent: I discussed the limitations, risks, security and privacy concerns of performing an evaluation and management service by telephone and the availability of in person appointments. I also discussed with the patient that there may be a patient responsible charge related to this service. The patient expressed understanding and agreed to proceed.   Location of Patient: Home   Location of Provider: Clinic    Persons participating in Telemedicine visit: Joanny Finland [facility] Dr. Juleen China      History of Present Illness: Patient has a visit for follow up on chronic medical conditions.  Reports that she has concerns about her left foot. She has had swelling and pain since last Friday and started being unable to ambulate. She was eventually able to get into the Urgent Care yesterday. She was prescribed Cephalexin 500 mg BID for presumed cellulitis. She has taken two doses so far. Edema and erythema have started to go down. Pain is improving but still present.   Diabetes mellitus, Type 2 Disease Monitoring             Blood Sugar Ranges: 142 after coffee this morning, 120 this afternoon              Polyuria: no             Visual problems: no   Urine Microalbumin 7 (Oct 2020)   Last A1C: 6.9 (May 2021)   Medications: Not on any currently. Discontinued Metformin in May 2021 due to A1c <7% and age >33.  Medication Compliance: N/A  Medication Side Effects             Hypoglycemia: no   Chronic HTN Disease Monitoring:  Home BP Monitoring - 120-140/70s  Chest pain- no  Dyspnea- no Headache - no  Medications: Metprolol XL 50 mg, Irbesartan 300 mg, Amlodipine 10 mg, Spironolactone 50 mg  Compliance- yes Lightheadedness- no  Edema- no     Past Medical History:  Diagnosis Date  . Essential hypertension   . Mixed hyperlipidemia   . Parotid mass   . Pulmonary nodule   . Type 2 diabetes mellitus without complication, without long-term current use of insulin (HCC)    Allergies  Allergen Reactions  . Amoxicillin Diarrhea    severe  . Maxzide [Hydrochlorothiazide W-Triamterene] Other (See Comments)    Dizziness    Current Outpatient Medications on File Prior to Visit  Medication Sig Dispense Refill  . amLODipine (NORVASC) 10 MG tablet TAKE 1 TABLET(10 MG) BY MOUTH DAILY 15 tablet 1  . aspirin EC 81 MG tablet Take 81 mg by mouth daily.    . cephALEXin (KEFLEX) 500 MG capsule Take 1 capsule (500 mg total) by mouth 2 (two) times daily. 10 capsule 0  . irbesartan (AVAPRO) 300 MG tablet Take 1 tablet (300 mg total) by mouth daily. 90 tablet 1  . metoprolol succinate (TOPROL-XL) 50 MG 24 hr tablet Take 1 tablet (50 mg total) by mouth daily. 90 tablet 1  . Multiple Vitamin (MULTIVITAMIN WITH MINERALS) TABS tablet Take 1 tablet by mouth daily.    . pravastatin (PRAVACHOL) 80 MG tablet Take 1 tablet (80 mg total) by mouth every evening. 90 tablet 1  . spironolactone (ALDACTONE) 50 MG tablet Take 1 tablet (50 mg total) by mouth daily.  90 tablet 0   No current facility-administered medications on file prior to visit.    Observations/Objective: NAD. Speaking clearly.  Work of breathing normal.  Alert and oriented. Mood appropriate.   Assessment and Plan: 1. Type 2 diabetes mellitus with other circulatory complication, without long-term current use of insulin (HCC) Last A1c showed very good control, actually more tightly controlled than would like for age. Therefore, we discontinued her Metformin and fasting CBGs have remained well controlled. Will repeat A1c to monitor.  - Hemoglobin A1c; Future - Microalbumin/Creatinine Ratio, Urine; Future - Comprehensive metabolic panel; Future - Lipid Panel; Future  2. Essential  hypertension BP around goal. Asymptomatic. Continue to monitor.  - CBC with Differential; Future - Comprehensive metabolic panel; Future - Lipid Panel; Future - spironolactone (ALDACTONE) 50 MG tablet; Take 1 tablet (50 mg total) by mouth daily.  Dispense: 90 tablet; Refill: 1  3. Hyperlipidemia associated with type 2 diabetes mellitus (HCC) Continue Pravastatin.  - Lipid Panel; Future  4. Cellulitis of left lower extremity Seems to be improving with Keflex. Discussed signs of antibiotic failure and reasons to seek return care. Discussed supportive care measures such as rest, elevation, ice.   5. Bunion of great toe of left foot - Ambulatory referral to Podiatry   Follow Up Instructions: Lab visit 10/15; 3 month fu    I discussed the assessment and treatment plan with the patient. The patient was provided an opportunity to ask questions and all were answered. The patient agreed with the plan and demonstrated an understanding of the instructions.   The patient was advised to call back or seek an in-person evaluation if the symptoms worsen or if the condition fails to improve as anticipated.     I provided 26 minutes total of non-face-to-face time during this encounter including median intraservice time, reviewing previous notes, investigations, ordering medications, medical decision making, coordinating care and patient verbalized understanding at the end of the visit.    Phill Myron, D.O. Primary Care at [facility]  04/18/2020, 2:12 PM

## 2020-04-19 ENCOUNTER — Other Ambulatory Visit: Payer: Self-pay

## 2020-04-19 ENCOUNTER — Other Ambulatory Visit (INDEPENDENT_AMBULATORY_CARE_PROVIDER_SITE_OTHER): Payer: Medicare Other

## 2020-04-19 DIAGNOSIS — E1169 Type 2 diabetes mellitus with other specified complication: Secondary | ICD-10-CM

## 2020-04-19 DIAGNOSIS — E785 Hyperlipidemia, unspecified: Secondary | ICD-10-CM

## 2020-04-19 DIAGNOSIS — I10 Essential (primary) hypertension: Secondary | ICD-10-CM | POA: Diagnosis not present

## 2020-04-19 DIAGNOSIS — E1159 Type 2 diabetes mellitus with other circulatory complications: Secondary | ICD-10-CM | POA: Diagnosis not present

## 2020-04-20 LAB — CBC WITH DIFFERENTIAL/PLATELET
Basophils Absolute: 0.1 10*3/uL (ref 0.0–0.2)
Basos: 1 %
EOS (ABSOLUTE): 0.2 10*3/uL (ref 0.0–0.4)
Eos: 3 %
Hematocrit: 52.7 % — ABNORMAL HIGH (ref 34.0–46.6)
Hemoglobin: 17.4 g/dL — ABNORMAL HIGH (ref 11.1–15.9)
Immature Grans (Abs): 0 10*3/uL (ref 0.0–0.1)
Immature Granulocytes: 0 %
Lymphocytes Absolute: 1.5 10*3/uL (ref 0.7–3.1)
Lymphs: 31 %
MCH: 26.9 pg (ref 26.6–33.0)
MCHC: 33 g/dL (ref 31.5–35.7)
MCV: 82 fL (ref 79–97)
Monocytes Absolute: 0.5 10*3/uL (ref 0.1–0.9)
Monocytes: 9 %
Neutrophils Absolute: 2.7 10*3/uL (ref 1.4–7.0)
Neutrophils: 56 %
Platelets: 121 10*3/uL — ABNORMAL LOW (ref 150–450)
RBC: 6.46 x10E6/uL — ABNORMAL HIGH (ref 3.77–5.28)
RDW: 14.6 % (ref 11.7–15.4)
WBC: 4.9 10*3/uL (ref 3.4–10.8)

## 2020-04-20 LAB — COMPREHENSIVE METABOLIC PANEL
ALT: 18 IU/L (ref 0–32)
AST: 21 IU/L (ref 0–40)
Albumin/Globulin Ratio: 1.8 (ref 1.2–2.2)
Albumin: 4.6 g/dL (ref 3.7–4.7)
Alkaline Phosphatase: 105 IU/L (ref 44–121)
BUN/Creatinine Ratio: 19 (ref 12–28)
BUN: 16 mg/dL (ref 8–27)
Bilirubin Total: 0.7 mg/dL (ref 0.0–1.2)
CO2: 23 mmol/L (ref 20–29)
Calcium: 9.8 mg/dL (ref 8.7–10.3)
Chloride: 102 mmol/L (ref 96–106)
Creatinine, Ser: 0.86 mg/dL (ref 0.57–1.00)
GFR calc Af Amer: 74 mL/min/{1.73_m2} (ref >59)
GFR calc non Af Amer: 64 mL/min/{1.73_m2} (ref >59)
Globulin, Total: 2.5 g/dL (ref 1.5–4.5)
Glucose: 129 mg/dL — ABNORMAL HIGH (ref 65–99)
Potassium: 4.4 mmol/L (ref 3.5–5.2)
Sodium: 139 mmol/L (ref 134–144)
Total Protein: 7.1 g/dL (ref 6.0–8.5)

## 2020-04-20 LAB — HEMOGLOBIN A1C
Est. average glucose Bld gHb Est-mCnc: 154 mg/dL
Hgb A1c MFr Bld: 7 % — ABNORMAL HIGH (ref 4.8–5.6)

## 2020-04-20 LAB — LIPID PANEL
Chol/HDL Ratio: 3.3 ratio (ref 0.0–4.4)
Cholesterol, Total: 160 mg/dL (ref 100–199)
HDL: 48 mg/dL (ref >39)
LDL Chol Calc (NIH): 97 mg/dL (ref 0–99)
Triglycerides: 81 mg/dL (ref 0–149)
VLDL Cholesterol Cal: 15 mg/dL (ref 5–40)

## 2020-04-20 LAB — MICROALBUMIN / CREATININE URINE RATIO
Creatinine, Urine: 151.6 mg/dL
Microalb/Creat Ratio: 18 mg/g creat (ref 0–29)
Microalbumin, Urine: 27.3 ug/mL

## 2020-04-26 NOTE — Progress Notes (Signed)
Patient notified of results & recommendations. Expressed understanding.  States that she has never been evaluated by Hematology. Is agreeable to a referral being placed.

## 2020-04-29 ENCOUNTER — Other Ambulatory Visit: Payer: Self-pay | Admitting: Family Medicine

## 2020-04-29 DIAGNOSIS — D696 Thrombocytopenia, unspecified: Secondary | ICD-10-CM

## 2020-04-29 DIAGNOSIS — D751 Secondary polycythemia: Secondary | ICD-10-CM

## 2020-04-29 NOTE — Progress Notes (Signed)
Patient ID: Melanie Weaver, female   DOB: Sep 27, 1940, 79 y.o.   MRN: [id number]  See recent lab note. PCP recommended heme/onc eval due to low platelets and increased Hgb. Referral placed

## 2020-04-30 ENCOUNTER — Telehealth: Payer: Self-pay | Admitting: Hematology and Oncology

## 2020-04-30 NOTE — Telephone Encounter (Signed)
Received a new hem referral from Dr. Chapman Fitch for polycythemia/thrombocytopenia. Melanie Weaver has been scheduled to see Dr. Lorenso Courier on 11/15 at 1pm. Letter mailed to the pt.

## 2020-05-02 ENCOUNTER — Ambulatory Visit (INDEPENDENT_AMBULATORY_CARE_PROVIDER_SITE_OTHER): Payer: Medicare Other

## 2020-05-02 ENCOUNTER — Other Ambulatory Visit: Payer: Self-pay | Admitting: Podiatry

## 2020-05-02 ENCOUNTER — Encounter: Payer: Self-pay | Admitting: Podiatry

## 2020-05-02 ENCOUNTER — Other Ambulatory Visit: Payer: Self-pay

## 2020-05-02 ENCOUNTER — Ambulatory Visit (INDEPENDENT_AMBULATORY_CARE_PROVIDER_SITE_OTHER): Payer: Medicare Other | Admitting: Podiatry

## 2020-05-02 DIAGNOSIS — M109 Gout, unspecified: Secondary | ICD-10-CM | POA: Diagnosis not present

## 2020-05-02 DIAGNOSIS — M2011 Hallux valgus (acquired), right foot: Secondary | ICD-10-CM

## 2020-05-02 MED ORDER — METHYLPREDNISOLONE 4 MG PO TBPK: ORAL_TABLET | ORAL | 0 refills | Status: DC

## 2020-05-02 NOTE — Patient Instructions (Signed)

## 2020-05-04 NOTE — Progress Notes (Signed)
Subjective:  Patient ID: Melanie Weaver, female    DOB: 02-10-1941,  MRN: [id number] HPI Chief Complaint  Patient presents with  . Foot Pain    1st MPJ and dorsal forefoot left - aching, swelling, redness x 3 weeks, PCP Rx'd cephalexin, said was cellulitis, still very swollen and painful  . Diabetes    Last a1c was 7.0  . New Patient (Initial Visit)    79 y.o. female presents with the above complaint.   ROS: Denies fever chills nausea vomiting muscle aches pains calf pain back pain chest pain shortness of breath.  Past Medical History:  Diagnosis Date  . Essential hypertension   . Mixed hyperlipidemia   . Parotid mass   . Pulmonary nodule   . Type 2 diabetes mellitus without complication, without long-term current use of insulin [facility])    Past Surgical History:  Procedure Laterality Date  . ABDOMINAL HYSTERECTOMY      Current Outpatient Medications:  .  amLODipine (NORVASC) 10 MG tablet, TAKE 1 TABLET(10 MG) BY MOUTH DAILY, Disp: 15 tablet, Rfl: 1 .  aspirin EC 81 MG tablet, Take 81 mg by mouth daily., Disp: , Rfl:  .  irbesartan (AVAPRO) 300 MG tablet, Take 1 tablet (300 mg total) by mouth daily., Disp: 90 tablet, Rfl: 1 .  methylPREDNISolone (MEDROL DOSEPAK) 4 MG TBPK tablet, 6 day dose pack - take as directed, Disp: 21 tablet, Rfl: 0 .  metoprolol succinate (TOPROL-XL) 50 MG 24 hr tablet, Take 1 tablet (50 mg total) by mouth daily., Disp: 90 tablet, Rfl: 1 .  Multiple Vitamin (MULTIVITAMIN WITH MINERALS) TABS tablet, Take 1 tablet by mouth daily., Disp: , Rfl:  .  pravastatin (PRAVACHOL) 80 MG tablet, Take 1 tablet (80 mg total) by mouth every evening., Disp: 90 tablet, Rfl: 1 .  spironolactone (ALDACTONE) 50 MG tablet, Take 1 tablet (50 mg total) by mouth daily., Disp: 90 tablet, Rfl: 1  Allergies  Allergen Reactions  . Amoxicillin Diarrhea    severe  . Maxzide [Hydrochlorothiazide W-Triamterene] Other (See Comments)    Dizziness   Review of Systems Objective:    There were no vitals filed for this visit.  General: Well developed, nourished, in no acute distress, alert and oriented x3   Dermatological: Skin is warm, dry and supple bilateral. Nails x 10 are well maintained; remaining integument appears unremarkable at this time. There are no open sores, no preulcerative lesions, no rash or signs of infection present.  Vascular: Dorsalis Pedis artery and Posterior Tibial artery pedal pulses are 2/4 bilateral with immedate capillary fill time. Pedal hair growth present. No varicosities and no lower extremity edema present bilateral.   Neruologic: Grossly intact via light touch bilateral. Vibratory intact via tuning fork bilateral. Protective threshold with Semmes Wienstein monofilament intact to all pedal sites bilateral. Patellar and Achilles deep tendon reflexes 2+ bilateral. No Babinski or clonus noted bilateral.   Musculoskeletal: No gross boney pedal deformities bilateral. No pain, crepitus, or limitation noted with foot and ankle range of motion bilateral. Muscular strength 5/5 in all groups tested bilateral.  She has pain on palpation and range of motion of the first metatarsophalangeal joint of the left foot.  It is edematous and warm to the touch.  There are no open lesions or wounds.  Gait: Unassisted, Nonantalgic.    Radiographs:  Radiographs taken today demonstrate an osseously mature individual with soft tissue swelling to the medial and dorsal aspect of the first metatarsophalangeal joint.  It appears to be  more dense than the surrounding tissue otherwise soft tissue margins appear relatively normal except some fluid in the posterior ankle area.  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  I also started her on methylprednisolone and her in about 3 weeks.  We will consider a uric acid  level.     Ani Deoliveira T. [place], [place]

## 2020-05-20 ENCOUNTER — Other Ambulatory Visit: Payer: Self-pay

## 2020-05-20 ENCOUNTER — Inpatient Hospital Stay: Payer: Medicare Other | Attending: Hematology and Oncology | Admitting: Hematology and Oncology

## 2020-05-20 ENCOUNTER — Inpatient Hospital Stay: Payer: Medicare Other

## 2020-05-20 VITALS — BP 171/67 | HR 95 | Temp 98.9°F | Resp 18 | Ht 62.5 in | Wt 184.0 lb

## 2020-05-20 DIAGNOSIS — E119 Type 2 diabetes mellitus without complications: Secondary | ICD-10-CM | POA: Diagnosis not present

## 2020-05-20 DIAGNOSIS — D649 Anemia, unspecified: Secondary | ICD-10-CM | POA: Diagnosis not present

## 2020-05-20 DIAGNOSIS — D45 Polycythemia vera: Secondary | ICD-10-CM

## 2020-05-20 DIAGNOSIS — D751 Secondary polycythemia: Secondary | ICD-10-CM | POA: Diagnosis present

## 2020-05-20 DIAGNOSIS — D696 Thrombocytopenia, unspecified: Secondary | ICD-10-CM | POA: Insufficient documentation

## 2020-05-20 LAB — CBC WITH DIFFERENTIAL (CANCER CENTER ONLY)
Abs Immature Granulocytes: 0.01 10*3/uL (ref 0.00–0.07)
Basophils Absolute: 0 10*3/uL (ref 0.0–0.1)
Basophils Relative: 1 %
Eosinophils Absolute: 0 10*3/uL (ref 0.0–0.5)
Eosinophils Relative: 1 %
HCT: 51.4 % — ABNORMAL HIGH (ref 36.0–46.0)
Hemoglobin: 16.6 g/dL — ABNORMAL HIGH (ref 12.0–15.0)
Immature Granulocytes: 0 %
Lymphocytes Relative: 17 %
Lymphs Abs: 1 10*3/uL (ref 0.7–4.0)
MCH: 26.2 pg (ref 26.0–34.0)
MCHC: 32.3 g/dL (ref 30.0–36.0)
MCV: 81.1 fL (ref 80.0–100.0)
Monocytes Absolute: 0.4 10*3/uL (ref 0.1–1.0)
Monocytes Relative: 7 %
Neutro Abs: 4.5 10*3/uL (ref 1.7–7.7)
Neutrophils Relative %: 74 %
Platelet Count: 158 10*3/uL (ref 150–400)
RBC: 6.34 MIL/uL — ABNORMAL HIGH (ref 3.87–5.11)
RDW: 15.1 % (ref 11.5–15.5)
WBC Count: 6.1 10*3/uL (ref 4.0–10.5)
nRBC: 0 % (ref 0.0–0.2)

## 2020-05-20 LAB — IRON AND TIBC
Iron: 64 ug/dL (ref 41–142)
Saturation Ratios: 22 % (ref 21–57)
TIBC: 289 ug/dL (ref 236–444)
UIBC: 225 ug/dL (ref 120–384)

## 2020-05-20 LAB — RETICULOCYTES
Immature Retic Fract: 11.1 % (ref 2.3–15.9)
RBC.: 6.32 MIL/uL — ABNORMAL HIGH (ref 3.87–5.11)
Retic Count, Absolute: 94.2 10*3/uL (ref 19.0–186.0)
Retic Ct Pct: 1.5 % (ref 0.4–3.1)

## 2020-05-20 LAB — CMP (CANCER CENTER ONLY)
ALT: 18 U/L (ref 0–44)
AST: 16 U/L (ref 15–41)
Albumin: 3.7 g/dL (ref 3.5–5.0)
Alkaline Phosphatase: 88 U/L (ref 38–126)
Anion gap: 8 (ref 5–15)
BUN: 18 mg/dL (ref 8–23)
CO2: 27 mmol/L (ref 22–32)
Calcium: 9.7 mg/dL (ref 8.9–10.3)
Chloride: 104 mmol/L (ref 98–111)
Creatinine: 1.17 mg/dL — ABNORMAL HIGH (ref 0.44–1.00)
GFR, Estimated: 47 mL/min — ABNORMAL LOW (ref >60)
Glucose, Bld: 140 mg/dL — ABNORMAL HIGH (ref 70–99)
Potassium: 4 mmol/L (ref 3.5–5.1)
Sodium: 139 mmol/L (ref 135–145)
Total Bilirubin: 0.7 mg/dL (ref 0.3–1.2)
Total Protein: 7.1 g/dL (ref 6.5–8.1)

## 2020-05-20 LAB — FERRITIN: Ferritin: 107 ng/mL (ref 11–307)

## 2020-05-20 LAB — LACTATE DEHYDROGENASE: LDH: 176 U/L (ref 98–192)

## 2020-05-20 NOTE — Progress Notes (Signed)
[place] [phone]   Fax:([phone]) [place] NOTE  Patient Care Team: Melanie Bang, DO as PCP - General (Family Medicine)  Hematological/Oncological History # Polycythemia # Thrombocytopenia 1) 04/11/2013: WBC 5.6, Hgb 17.0, HCT 50.2, MCV 76.3, Plt 196 2) 04/19/2020: WBC 4.9, Hgb 17.4, HCT 52.7, MCV 82, Plt 121 3) 05/20/2020: establish care with Dr. Lorenso Weaver   CHIEF COMPLAINTS/PURPOSE OF CONSULTATION:  " Polycythemia/Thrombocytopenia "  HISTORY OF PRESENTING ILLNESS:  Melanie 79 y.o. female with medical history significant for hypertension, HLD, and DM type II who presents for evaluation of anemia/thrombocytopenia.   On review of the previous records [name] has had an elevated hemoglobin since at least 04/11/2013 at which time she had a hemoglobin of 17, hematocrit of 50.2, MCV of 76.3, and a platelet count of 196.  The patient began becoming thrombocytopenic on 04/07/1930 2019 at which time her hemoglobin was 17.7, MCV was 79.7, hematocrit was 55.1, and the platelets were 147.  Most recently on 04/19/2020 the patient was found to have white blood cell count of 4.9, hemoglobin of 17.4, HCT of 52.7, MCV of 82, platelet count of 121.  Due to concern for the elevated hemoglobin and worsening platelet count the patient was referred to hematology for further evaluation and management.  On exam today [name] notes that she is here because of a abnormality in her CBC.  She reports that she is an active smoker and typically smokes 5 cigarettes/day, but often smokes more when she is stressed out.  She notes that she has never in her life smoked more than 1 pack/day.  Reports that she started smoking approximately 79 years of age.  She also endorses snoring at home as reported by her husband while he was still alive (unfortunately he is passed away).  She does that she has had no issues with itching, rash, headaches, or blood clots.   She does endorse having fatigue she notes this is an issue that started about age 41.  She notes that her steps are slower and her energy is simply lower than it had been previously.  On further discussion she notes that she had a mother lived to be the age of 71 and died of old age and a father who passed away of heart attack.  She reports that she has 2 half sisters and one half brother and that they are otherwise healthy.  She has 3 children one with special needs and one son that required a kidney transfusion.  Her daughter is healthy.  She has 4 grandchildren.  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  She currently denies having any issues with weight loss.  A full 10 point ROS is listed below.  MEDICAL HISTORY:  Past Medical History:  Diagnosis Date  . Essential hypertension   . Mixed hyperlipidemia   . Parotid mass   . Pulmonary nodule   . Type 2 diabetes mellitus without complication, without long-term current use of insulin ([place])     SURGICAL HISTORY: Past Surgical History:  Procedure Laterality Date  . ABDOMINAL HYSTERECTOMY      SOCIAL HISTORY: Social History   Socioeconomic History  . Marital status: Married    Spouse name: Not on file  . Number of children: Not on file  . Years of education: Not on file  . Highest education level: Not on file  Occupational History  . Not on file  Tobacco Use  .  Smoking status: Current Every Day Smoker    Packs/day: 0.50    Years: 40.00    Pack years: 20.00    Types: Cigarettes  . Smokeless tobacco: Never Used  . Tobacco comment:  1-2 cigs per day  Vaping Use  . Vaping Use: Never used  Substance and Sexual Activity  . Alcohol use: No  . Drug use: No  . Sexual activity: Not Currently  Other Topics Concern  . Not on file  Social History Narrative   Lives with son with who is mentally handicapped.   Social Determinants of Health   Financial Resource Strain:   . Difficulty of Paying Living Expenses:  Not on file  Food Insecurity:   . Worried About Charity fundraiser in the Last Year: Not on file  . Ran Out of Food in the Last Year: Not on file  Transportation Needs:   . Lack of Transportation (Medical): Not on file  . Lack of Transportation (Non-Medical): Not on file  Physical Activity:   . Days of Exercise per Week: Not on file  . Minutes of Exercise per Session: Not on file  Stress:   . Feeling of Stress : Not on file  Social Connections:   . Frequency of Communication with Friends and Family: Not on file  . Frequency of Social Gatherings with Friends and Family: Not on file  . Attends Religious Services: Not on file  . Active Member of Clubs or Organizations: Not on file  . Attends Archivist Meetings: Not on file  . Marital Status: Not on file  Intimate Partner Violence:   . Fear of Current or Ex-Partner: Not on file  . Emotionally Abused: Not on file  . Physically Abused: Not on file  . Sexually Abused: Not on file    FAMILY HISTORY: Family History  Problem Relation Age of Onset  . CVA Brother   . Diabetes Son   . Hypertension Son   . Mental retardation Son   . Stroke Son   . Diabetes Daughter     ALLERGIES:  is allergic to amoxicillin and maxzide [hydrochlorothiazide w-triamterene].  MEDICATIONS:  Current Outpatient Medications  Medication Sig Dispense Refill  . amLODipine (NORVASC) 10 MG tablet TAKE 1 TABLET(10 MG) BY MOUTH DAILY 15 tablet 1  . aspirin EC 81 MG tablet Take 81 mg by mouth daily.    . irbesartan (AVAPRO) 300 MG tablet Take 1 tablet (300 mg total) by mouth daily. 90 tablet 1  . metoprolol succinate (TOPROL-XL) 50 MG 24 hr tablet Take 1 tablet (50 mg total) by mouth daily. 90 tablet 1  . Multiple Vitamin (MULTIVITAMIN WITH MINERALS) TABS tablet Take 1 tablet by mouth daily.    . pravastatin (PRAVACHOL) 80 MG tablet Take 1 tablet (80 mg total) by mouth every evening. 90 tablet 1  . spironolactone (ALDACTONE) 50 MG tablet Take 1 tablet  (50 mg total) by mouth daily. 90 tablet 1   No current facility-administered medications for this visit.    REVIEW OF SYSTEMS:   Constitutional: ( - ) fevers, ( - )  chills , ( - ) night sweats Eyes: ( - ) blurriness of vision, ( - ) double vision, ( - ) watery eyes Ears, nose, mouth, throat, and face: ( - ) mucositis, ( - ) sore throat Respiratory: ( - ) cough, ( - ) dyspnea, ( - ) wheezes Cardiovascular: ( - ) palpitation, ( - ) chest discomfort, ( - ) lower extremity  swelling Gastrointestinal:  ( - ) nausea, ( - ) heartburn, ( - ) change in bowel habits Skin: ( - ) abnormal skin rashes Lymphatics: ( - ) new lymphadenopathy, ( - ) easy bruising Neurological: ( - ) numbness, ( - ) tingling, ( - ) new weaknesses Behavioral/Psych: ( - ) mood change, ( - ) new changes  All other systems were reviewed with the patient and are negative.  PHYSICAL EXAMINATION: ECOG PERFORMANCE STATUS: 2 - Symptomatic, <50% confined to bed  Vitals:   05/20/20 1315  BP: (!) 171/67  Pulse: 95  Resp: 18  Temp: 98.9 F (37.2 C)  SpO2: 97%   Filed Weights   05/20/20 1315  Weight: 184 lb (83.5 kg)    GENERAL: well appearing elderly African American female in NAD  SKIN: skin color, texture, turgor are normal, no rashes or significant lesions EYES: conjunctiva are pink and non-injected, sclera clear LUNGS: clear to auscultation and percussion with normal breathing effort HEART: regular rate & rhythm and no murmurs and no lower extremity edema Musculoskeletal: no cyanosis of digits and no clubbing  PSYCH: alert & oriented x 3, fluent speech NEURO: no focal motor/sensory deficits  LABORATORY DATA:  I have reviewed the data as listed CBC Latest Ref Rng & Units 05/20/2020 04/19/2020 05/09/2018  WBC 4.0 - 10.5 K/uL 6.1 4.9 7.5  Hemoglobin 12.0 - 15.0 g/dL 16.6(H) 17.4(H) 17.5(H)  Hematocrit 36 - 46 % 51.4(H) 52.7(H) 56.9(H)  Platelets 150 - 400 K/uL 158 121(L) 131(L)    CMP Latest Ref Rng & Units  05/20/2020 04/19/2020 04/17/2019  Glucose 70 - 99 mg/dL 140(H) 129(H) 123(H)  BUN 8 - 23 mg/dL 18 16 23   Creatinine 0.44 - 1.00 mg/dL 1.17(H) 0.86 0.98  Sodium 135 - 145 mmol/L 139 139 137  Potassium 3.5 - 5.1 mmol/L 4.0 4.4 5.0  Chloride 98 - 111 mmol/L 104 102 101  CO2 22 - 32 mmol/L 27 23 21   Calcium 8.9 - 10.3 mg/dL 9.7 9.8 10.3  Total Protein 6.5 - 8.1 g/dL 7.1 7.1 6.9  Total Bilirubin 0.3 - 1.2 mg/dL 0.7 0.7 0.5  Alkaline Phos 38 - 126 U/L 88 105 98  AST 15 - 41 U/L 16 21 18   ALT 0 - 44 U/L 18 18 21     RADIOGRAPHIC STUDIES: DG Foot Complete Right  Result Date: 05/02/2020 Please see detailed radiograph report in office note.   ASSESSMENT & PLAN Melanie 79 y.o. female with medical history significant for hypertension, HLD, and DM type II who presents for evaluation of anemia/thrombocytopenia.  After review the labs, review the records, discussion with the patient the findings are most consistent with a polycythemia of unclear etiology.  The differential for this would include secondary causes such as smoking or OSA, versus a myeloproliferative neoplasm.  In order to rule this out today we will collect a CBC, CMP, LDH, JAK2 panel with reflex, and BCR able.  In the event the patient is found to have an MPN we would need to proceed with a bone marrow biopsy.  In the event her erythropoietin was normal or high we would need to consider further evaluation with a sleep study.  We will put a placeholder visit in approximately 4 weeks time, however if NPM is not detected we could extend this out to 3 months with an interval sleep study.  # Polycythemia # Thrombocytopenia --findings are concerning for a longstanding myeloproliferative neoplasm. Patient already on ASA 67m PO --will order iron panel,  ferritin, CBC, CMP, and LDH --additionally will collect JAK2 panel w/ reflex and BCR/ABL --no clear indication for a bone marrow biopsy, though this may be indicated if an MPN mutation is  found. --placeholder visit scheduled in approximately 4 weeks time.   Orders Placed This Encounter  Procedures  . CBC with Differential (Cancer Center Only)    Standing Status:   Future    Number of Occurrences:   1    Standing Expiration Date:   05/20/2021  . Reticulocytes    Standing Status:   Future    Number of Occurrences:   1    Standing Expiration Date:   05/20/2021  . CMP ([facility] only)    Standing Status:   Future    Number of Occurrences:   1    Standing Expiration Date:   05/20/2021  . Iron and TIBC    Standing Status:   Future    Number of Occurrences:   1    Standing Expiration Date:   05/20/2021  . Ferritin    Standing Status:   Future    Number of Occurrences:   1    Standing Expiration Date:   05/20/2021  . Lactate dehydrogenase (LDH)    Standing Status:   Future    Number of Occurrences:   1    Standing Expiration Date:   05/20/2021  . Erythropoietin    Standing Status:   Future    Number of Occurrences:   1    Standing Expiration Date:   05/20/2021  . JAK2 (INCLUDING V617F AND EXON 12), MPL,& CALR W/RFL MPN PANEL (NGS)    Standing Status:   Future    Number of Occurrences:   1    Standing Expiration Date:   05/20/2021  . BCR ABL1 FISH ([facility])    Standing Status:   Future    Number of Occurrences:   1    Standing Expiration Date:   05/20/2021    All questions were answered. The patient knows to call the clinic with any problems, questions or concerns.  A total of more than 60 minutes were spent on this encounter and over half of that time was spent on counseling and coordination of care as outlined above.   Ledell Peoples, MD Department of Hematology/Oncology [place] [facility] Phone: [phone] Pager: [phone] Email: [email or web address]  05/20/2020 5:31 PM

## 2020-05-21 LAB — ERYTHROPOIETIN: Erythropoietin: 21.5 m[IU]/mL — ABNORMAL HIGH (ref 2.6–18.5)

## 2020-05-22 ENCOUNTER — Telehealth: Payer: Self-pay | Admitting: Hematology and Oncology

## 2020-05-22 NOTE — Telephone Encounter (Signed)
Scheduled per los. Called and spoke with patient. Confirmed appt 

## 2020-05-23 ENCOUNTER — Ambulatory Visit: Payer: Medicare Other | Admitting: Podiatry

## 2020-05-23 ENCOUNTER — Other Ambulatory Visit: Payer: Self-pay

## 2020-05-23 ENCOUNTER — Encounter: Payer: Self-pay | Admitting: Podiatry

## 2020-05-23 DIAGNOSIS — M109 Gout, unspecified: Secondary | ICD-10-CM

## 2020-05-23 NOTE — Progress Notes (Signed)
She presents today for follow-up of gout to the dorsal aspect of her left foot and first metatarsophalangeal joint.  She states that after the steroid pack I gave her the gout with away completely she had not had any more problems since then.  Objective: Vital signs are stable she is alert oriented x3 she still has some postinflammatory hyperpigmentation to the dorsal aspect of the foot but no erythema cellulitis drainage or odor and no warmth greater than the other foot.  Assessment: Resolved gouty arthritis.  Plan: Should this patient call this office requesting to be seen by Dr. We need to get her in immediately at the very least we need to have an arthritic profile prescribed by our nurse so that she can go at least have the blood drawn that day.  We are looking for an arthritic profile including a uric acid and sed rate.  CBC is necessary as well.

## 2020-05-29 ENCOUNTER — Telehealth: Payer: Self-pay | Admitting: *Deleted

## 2020-05-29 LAB — JAK2 (INCLUDING V617F AND EXON 12), MPL,& CALR W/RFL MPN PANEL (NGS)

## 2020-05-29 LAB — BCR ABL1 FISH (GENPATH)

## 2020-05-29 NOTE — Telephone Encounter (Signed)
TCT patient regarding lab results. Spoke with patient and advised that her elevated hemoglobin is not due to a bone marrow disorder, but more likely due to either her smoking or obstructive sleep apnea.  Asked pt if she would be agreeable to a sleep study to rule out sleep apnea.  Pt states she prefers to work on quitting smoking.  Advised that she has an MD appt with labs on 07/10/20 and that we would re-check her hemoglobin at that time.  She states she would consider sleep study if that number hasn't decreased  By then.  Dr. Lorenso Courier made aware.

## 2020-05-29 NOTE — Telephone Encounter (Signed)
-----   Message from Orson Slick, MD sent at 05/29/2020  2:46 PM EST ----- Please let Melanie Weaver know that her workup showed her elevated Hgb is not due to a bone marrow disorder. It is likely secondary to smoking or obstructive sleep apnea. If she is agreeable we will schedule a sleep study to assure she does not have OSA. Otherwise, would recommend attempting to d/c smoking.  ----- Message ----- From: Interface, Lab In [place] Sent: 05/20/2020   2:17 PM EST To: Orson Slick, MD

## 2020-06-03 ENCOUNTER — Telehealth: Payer: Self-pay | Admitting: Hematology and Oncology

## 2020-06-03 NOTE — Telephone Encounter (Signed)
R/s appt per 11/24 sch msg - pt is aware of new appt date and time

## 2020-07-04 ENCOUNTER — Other Ambulatory Visit: Payer: Self-pay | Admitting: Cardiology

## 2020-07-10 ENCOUNTER — Ambulatory Visit: Payer: Medicare Other | Admitting: Hematology and Oncology

## 2020-07-10 ENCOUNTER — Other Ambulatory Visit: Payer: Medicare Other

## 2020-07-17 ENCOUNTER — Other Ambulatory Visit: Payer: Self-pay | Admitting: Internal Medicine

## 2020-07-17 ENCOUNTER — Telehealth: Payer: Self-pay | Admitting: Internal Medicine

## 2020-07-17 MED ORDER — PRAVASTATIN SODIUM 80 MG PO TABS
80.0000 mg | ORAL_TABLET | Freq: Every evening | ORAL | 1 refills | Status: DC
Start: 2020-07-17 — End: 2020-11-29

## 2020-07-17 NOTE — Telephone Encounter (Signed)
1) Medication(s) Requested (by name): pravastatin (PRAVACHOL) 80 MG tablet   2) Pharmacy of Choice: [facility]   3) Special Requests:   Approved medications will be sent to the pharmacy, we will reach out if there is an issue.  Requests made after 3pm may not be addressed until the following business day!  If a patient is unsure of the name of the medication(s) please note and ask patient to call back when they are able to provide all info, do not send to responsible party until all information is available!

## 2020-07-17 NOTE — Telephone Encounter (Signed)
Refill sent.   Phill Myron, D.O. Primary [facility] at [facility]  07/17/2020, 2:30 PM

## 2020-07-19 ENCOUNTER — Other Ambulatory Visit: Payer: Self-pay

## 2020-07-19 ENCOUNTER — Telehealth: Payer: Self-pay | Admitting: Internal Medicine

## 2020-07-19 DIAGNOSIS — I10 Essential (primary) hypertension: Secondary | ICD-10-CM

## 2020-07-19 MED ORDER — AMLODIPINE BESYLATE 10 MG PO TABS: 10.0000 mg | ORAL_TABLET | Freq: Every day | ORAL | 0 refills | Status: DC

## 2020-07-19 NOTE — Telephone Encounter (Signed)
Pt inquiring for a refill for amLODipine (NORVASC) 10 MG tablet sent to [id number] - [place], [address] [place]  Pt is all out of this medication- Please advise and thank you.

## 2020-08-14 ENCOUNTER — Other Ambulatory Visit: Payer: Self-pay

## 2020-08-14 ENCOUNTER — Inpatient Hospital Stay: Payer: Medicare Other | Attending: Hematology and Oncology

## 2020-08-14 ENCOUNTER — Other Ambulatory Visit: Payer: Self-pay | Admitting: Hematology and Oncology

## 2020-08-14 ENCOUNTER — Inpatient Hospital Stay: Payer: Medicare Other | Admitting: Hematology and Oncology

## 2020-08-14 VITALS — BP 144/91 | HR 71 | Temp 98.6°F | Resp 17 | Ht 62.5 in | Wt 181.2 lb

## 2020-08-14 DIAGNOSIS — D696 Thrombocytopenia, unspecified: Secondary | ICD-10-CM

## 2020-08-14 DIAGNOSIS — D751 Secondary polycythemia: Secondary | ICD-10-CM | POA: Insufficient documentation

## 2020-08-14 DIAGNOSIS — D45 Polycythemia vera: Secondary | ICD-10-CM

## 2020-08-14 DIAGNOSIS — Z833 Family history of diabetes mellitus: Secondary | ICD-10-CM | POA: Diagnosis not present

## 2020-08-14 DIAGNOSIS — E119 Type 2 diabetes mellitus without complications: Secondary | ICD-10-CM | POA: Diagnosis not present

## 2020-08-14 DIAGNOSIS — Z79899 Other long term (current) drug therapy: Secondary | ICD-10-CM | POA: Diagnosis not present

## 2020-08-14 DIAGNOSIS — F17210 Nicotine dependence, cigarettes, uncomplicated: Secondary | ICD-10-CM | POA: Diagnosis not present

## 2020-08-14 DIAGNOSIS — Z823 Family history of stroke: Secondary | ICD-10-CM | POA: Diagnosis not present

## 2020-08-14 DIAGNOSIS — Z818 Family history of other mental and behavioral disorders: Secondary | ICD-10-CM | POA: Diagnosis not present

## 2020-08-14 DIAGNOSIS — R42 Dizziness and giddiness: Secondary | ICD-10-CM | POA: Insufficient documentation

## 2020-08-14 DIAGNOSIS — Z880 Allergy status to penicillin: Secondary | ICD-10-CM | POA: Diagnosis not present

## 2020-08-14 DIAGNOSIS — Z8249 Family history of ischemic heart disease and other diseases of the circulatory system: Secondary | ICD-10-CM | POA: Insufficient documentation

## 2020-08-14 LAB — CMP (CANCER CENTER ONLY)
ALT: 16 U/L (ref 0–44)
AST: 18 U/L (ref 15–41)
Albumin: 4.1 g/dL (ref 3.5–5.0)
Alkaline Phosphatase: 97 U/L (ref 38–126)
Anion gap: 10 (ref 5–15)
BUN: 19 mg/dL (ref 8–23)
CO2: 27 mmol/L (ref 22–32)
Calcium: 9.9 mg/dL (ref 8.9–10.3)
Chloride: 102 mmol/L (ref 98–111)
Creatinine: 0.99 mg/dL (ref 0.44–1.00)
GFR, Estimated: 58 mL/min — ABNORMAL LOW (ref >60)
Glucose, Bld: 96 mg/dL (ref 70–99)
Potassium: 3.8 mmol/L (ref 3.5–5.1)
Sodium: 139 mmol/L (ref 135–145)
Total Bilirubin: 0.6 mg/dL (ref 0.3–1.2)
Total Protein: 7.4 g/dL (ref 6.5–8.1)

## 2020-08-14 LAB — CBC WITH DIFFERENTIAL (CANCER CENTER ONLY)
Abs Immature Granulocytes: 0.02 10*3/uL (ref 0.00–0.07)
Basophils Absolute: 0.1 10*3/uL (ref 0.0–0.1)
Basophils Relative: 1 %
Eosinophils Absolute: 0.1 10*3/uL (ref 0.0–0.5)
Eosinophils Relative: 2 %
HCT: 55 % — ABNORMAL HIGH (ref 36.0–46.0)
Hemoglobin: 17.4 g/dL — ABNORMAL HIGH (ref 12.0–15.0)
Immature Granulocytes: 0 %
Lymphocytes Relative: 29 %
Lymphs Abs: 1.5 10*3/uL (ref 0.7–4.0)
MCH: 26.3 pg (ref 26.0–34.0)
MCHC: 31.6 g/dL (ref 30.0–36.0)
MCV: 83.2 fL (ref 80.0–100.0)
Monocytes Absolute: 0.6 10*3/uL (ref 0.1–1.0)
Monocytes Relative: 11 %
Neutro Abs: 3 10*3/uL (ref 1.7–7.7)
Neutrophils Relative %: 57 %
Platelet Count: 156 10*3/uL (ref 150–400)
RBC: 6.61 MIL/uL — ABNORMAL HIGH (ref 3.87–5.11)
RDW: 17.2 % — ABNORMAL HIGH (ref 11.5–15.5)
WBC Count: 5.3 10*3/uL (ref 4.0–10.5)
nRBC: 0 % (ref 0.0–0.2)

## 2020-08-17 ENCOUNTER — Encounter: Payer: Self-pay | Admitting: Hematology and Oncology

## 2020-08-17 NOTE — Progress Notes (Signed)
[place] [phone]   Fax:(336) [phone]  PROGRESS NOTE  Patient Care Team: Nicolette Bang, DO as PCP - General (Family Medicine)  Hematological/Oncological History # Secondary Polycythemia # Thrombocytopenia 1) 04/11/2013: WBC 5.6, Hgb 17.0, HCT 50.2, MCV 76.3, Plt 196 2) 04/19/2020: WBC 4.9, Hgb 17.4, HCT 52.7, MCV 82, Plt 121 3) 05/20/2020: establish care with Dr. Lorenso Courier. MPN workup including JAK2 and BCR/ABL negative for an MPN. Findings consistent with secondary polycythemia.   Interval History:  Melanie Weaver 80 y.o. female with medical history significant for polycythemia/thrombocytopenia who presents for a follow up visit. The patient's last visit was on 05/20/2020. In the interim since the last visit she has had no major changes in her health.   On exam today [name] reports she has been doing well in the interim since her last visit.  She reports that she has more energy than previously and she has been try to get out of the house more.  Her strength is coming back and her breathing is quite good.  She does that she has tried to quit smoking and has cut down to approximately 5 to 6 cigarettes/day.  She reports also that she does have some trouble with sleeping at night, but this is improved from her prior visit as she now drinks some milk and eat some peanut butter before bed and helps her sleep all the way throughout the night.  She does still have some occasional episodes of dizziness.  She currently denies any fevers, chills, sweats, nausea, vomiting or diarrhea.  MEDICAL HISTORY:  Past Medical History:  Diagnosis Date  . Essential hypertension   . Mixed hyperlipidemia   . Parotid mass   . Pulmonary nodule   . Type 2 diabetes mellitus without complication, without long-term current use of insulin ([place])     SURGICAL HISTORY: Past Surgical History:  Procedure Laterality Date  . ABDOMINAL HYSTERECTOMY      SOCIAL  HISTORY: Social History   Socioeconomic History  . Marital status: Married    Spouse name: Not on file  . Number of children: Not on file  . Years of education: Not on file  . Highest education level: Not on file  Occupational History  . Not on file  Tobacco Use  . Smoking status: Current Every Day Smoker    Packs/day: 0.50    Years: 40.00    Pack years: 20.00    Types: Cigarettes  . Smokeless tobacco: Never Used  . Tobacco comment:  1-2 cigs per day  Vaping Use  . Vaping Use: Never used  Substance and Sexual Activity  . Alcohol use: No  . Drug use: No  . Sexual activity: Not Currently  Other Topics Concern  . Not on file  Social History Narrative   Lives with son with who is mentally handicapped.   Social Determinants of Health   Financial Resource Strain: Not on file  Food Insecurity: Not on file  Transportation Needs: Not on file  Physical Activity: Not on file  Stress: Not on file  Social Connections: Not on file  Intimate Partner Violence: Not on file    FAMILY HISTORY: Family History  Problem Relation Age of Onset  . CVA Brother   . Diabetes Son   . Hypertension Son   . Mental retardation Son   . Stroke Son   . Diabetes Daughter     ALLERGIES:  is allergic to amoxicillin and maxzide [hydrochlorothiazide w-triamterene].  MEDICATIONS:  Current  Outpatient Medications  Medication Sig Dispense Refill  . amLODipine (NORVASC) 10 MG tablet Take 1 tablet (10 mg total) by mouth daily. 30 tablet 0  . aspirin EC 81 MG tablet Take 81 mg by mouth daily.    . irbesartan (AVAPRO) 300 MG tablet Take 1 tablet (300 mg total) by mouth daily. 90 tablet 1  . metoprolol succinate (TOPROL-XL) 50 MG 24 hr tablet Take 1 tablet (50 mg total) by mouth daily. 90 tablet 1  . Multiple Vitamin (MULTIVITAMIN WITH MINERALS) TABS tablet Take 1 tablet by mouth daily.    . pravastatin (PRAVACHOL) 80 MG tablet Take 1 tablet (80 mg total) by mouth every evening. 90 tablet 1  .  spironolactone (ALDACTONE) 50 MG tablet Take 1 tablet (50 mg total) by mouth daily. 90 tablet 1   No current facility-administered medications for this visit.    REVIEW OF SYSTEMS:   Constitutional: ( - ) fevers, ( - )  chills , ( - ) night sweats Eyes: ( - ) blurriness of vision, ( - ) double vision, ( - ) watery eyes Ears, nose, mouth, throat, and face: ( - ) mucositis, ( - ) sore throat Respiratory: ( - ) cough, ( - ) dyspnea, ( - ) wheezes Cardiovascular: ( - ) palpitation, ( - ) chest discomfort, ( - ) lower extremity swelling Gastrointestinal:  ( - ) nausea, ( - ) heartburn, ( - ) change in bowel habits Skin: ( - ) abnormal skin rashes Lymphatics: ( - ) new lymphadenopathy, ( - ) easy bruising Neurological: ( - ) numbness, ( - ) tingling, ( - ) new weaknesses Behavioral/Psych: ( - ) mood change, ( - ) new changes  All other systems were reviewed with the patient and are negative.  PHYSICAL EXAMINATION:  Vitals:   08/14/20 1457  BP: (!) 144/91  Pulse: 71  Resp: 17  Temp: 98.6 F (37 C)  SpO2: 100%   Filed Weights   08/14/20 1457  Weight: 181 lb 3.2 oz (82.2 kg)    GENERAL: well appearing elderly African American female alert, no distress and comfortable SKIN: skin color, texture, turgor are normal, no rashes or significant lesions EYES: conjunctiva are pink and non-injected, sclera clear LUNGS: clear to auscultation and percussion with normal breathing effort HEART: regular rate & rhythm and no murmurs and no lower extremity edema Musculoskeletal: no cyanosis of digits and no clubbing  PSYCH: alert & oriented x 3, fluent speech NEURO: no focal motor/sensory deficits  LABORATORY DATA:  I have reviewed the data as listed CBC Latest Ref Rng & Units 08/14/2020 05/20/2020 04/19/2020  WBC 4.0 - 10.5 K/uL 5.3 6.1 4.9  Hemoglobin 12.0 - 15.0 g/dL 17.4(H) 16.6(H) 17.4(H)  Hematocrit 36.0 - 46.0 % 55.0(H) 51.4(H) 52.7(H)  Platelets 150 - 400 K/uL 156 158 121(L)    CMP  Latest Ref Rng & Units 08/14/2020 05/20/2020 04/19/2020  Glucose 70 - 99 mg/dL 96 140(H) 129(H)  BUN 8 - 23 mg/dL 19 18 16   Creatinine 0.44 - 1.00 mg/dL 0.99 1.17(H) 0.86  Sodium 135 - 145 mmol/L 139 139 139  Potassium 3.5 - 5.1 mmol/L 3.8 4.0 4.4  Chloride 98 - 111 mmol/L 102 104 102  CO2 22 - 32 mmol/L 27 27 23   Calcium 8.9 - 10.3 mg/dL 9.9 9.7 9.8  Total Protein 6.5 - 8.1 g/dL 7.4 7.1 7.1  Total Bilirubin 0.3 - 1.2 mg/dL 0.6 0.7 0.7  Alkaline Phos 38 - 126 U/L 97 88 105  AST 15 - 41 U/L 18 16 21   ALT 0 - 44 U/L 16 18 18     RADIOGRAPHIC STUDIES: No results found.  ASSESSMENT & PLAN [place] 80 y.o. female with medical history significant for polycythemia/thrombocytopenia who presents for a follow up visit.   After review the labs, the records, schedule the patient the findings most consistent with a secondary polycythemia.  The patient has erythropoietin levels which are currently within normal limits as well as a JAK2 panel with reflex and BCR able which were negative.  Given these findings the patient is most likely experiencing secondary polycythemia due to her smoking.  There may also be a component of obstructive sleep apnea, however the patient declines a sleep study at this time.  Given these findings would recommend return to clinic in approximately 6 months time in order to reevaluate.  We could consider discharge from clinic at that time assuming everything is stable.  We strongly encouraged her to call us if she would reconsider about the sleep study and do encourage her to stop smoking.  # Secondary Polycythemia # Thrombocytopenia, resolved --findings are consistent with a secondary polycythemia. Patient already on ASA 86m PO --negative JAK2 panel w/ reflex and BCR/ABL. Erythropoietin is currently within normal limits.  --no clear indication for a bone marrow biopsy --encourage smoking cessation --offered sleep study but the patient declines  --f/u in 6 months  time  No orders of the defined types were placed in this encounter.   All questions were answered. The patient knows to call the clinic with any problems, questions or concerns.  A total of more than 30 minutes were spent on this encounter and over half of that time was spent on counseling and coordination of care as outlined above.   JLedell Peoples MD Department of Hematology/[facility]Phone: [phone]: [phone]: [email or web address]  08/17/2020 4:13 PM

## 2020-08-19 ENCOUNTER — Telehealth: Payer: Self-pay | Admitting: Hematology and Oncology

## 2020-08-19 NOTE — Telephone Encounter (Signed)
Scheduled appt per 2/12 sch msg - mailed letter with appt date and time

## 2020-09-02 ENCOUNTER — Other Ambulatory Visit: Payer: Self-pay | Admitting: Internal Medicine

## 2020-09-02 DIAGNOSIS — I10 Essential (primary) hypertension: Secondary | ICD-10-CM

## 2020-09-03 ENCOUNTER — Other Ambulatory Visit: Payer: Self-pay

## 2020-09-03 MED ORDER — IRBESARTAN 300 MG PO TABS: 300.0000 mg | ORAL_TABLET | Freq: Every day | ORAL | 1 refills | Status: DC

## 2020-09-03 MED ORDER — METOPROLOL SUCCINATE ER 50 MG PO TB24: 50.0000 mg | ORAL_TABLET | Freq: Every day | ORAL | 1 refills | Status: DC

## 2020-11-29 ENCOUNTER — Other Ambulatory Visit: Payer: Self-pay | Admitting: Internal Medicine

## 2020-11-29 DIAGNOSIS — I10 Essential (primary) hypertension: Secondary | ICD-10-CM

## 2020-12-04 ENCOUNTER — Encounter: Payer: Self-pay | Admitting: Internal Medicine

## 2020-12-04 ENCOUNTER — Ambulatory Visit (INDEPENDENT_AMBULATORY_CARE_PROVIDER_SITE_OTHER): Payer: Medicare Other | Admitting: Internal Medicine

## 2020-12-04 ENCOUNTER — Other Ambulatory Visit: Payer: Self-pay

## 2020-12-04 VITALS — BP 151/62 | HR 65 | Resp 20 | Wt 182.0 lb

## 2020-12-04 DIAGNOSIS — I10 Essential (primary) hypertension: Secondary | ICD-10-CM | POA: Diagnosis not present

## 2020-12-04 DIAGNOSIS — E785 Hyperlipidemia, unspecified: Secondary | ICD-10-CM | POA: Diagnosis not present

## 2020-12-04 DIAGNOSIS — E1159 Type 2 diabetes mellitus with other circulatory complications: Secondary | ICD-10-CM

## 2020-12-04 LAB — GLUCOSE, POCT (MANUAL RESULT ENTRY): POC Glucose: 140 mg/dl — AB (ref 70–99)

## 2020-12-04 LAB — POCT GLYCOSYLATED HEMOGLOBIN (HGB A1C): Hemoglobin A1C: 7.3 % — AB (ref 4.0–5.6)

## 2020-12-04 MED ORDER — AMLODIPINE BESYLATE 10 MG PO TABS: ORAL_TABLET | ORAL | 2 refills | Status: DC

## 2020-12-04 MED ORDER — IRBESARTAN 300 MG PO TABS: 300.0000 mg | ORAL_TABLET | Freq: Every day | ORAL | 2 refills | Status: DC

## 2020-12-04 MED ORDER — PRAVASTATIN SODIUM 80 MG PO TABS: ORAL_TABLET | ORAL | 2 refills | Status: DC

## 2020-12-04 MED ORDER — METOPROLOL SUCCINATE ER 50 MG PO TB24: 50.0000 mg | ORAL_TABLET | Freq: Every day | ORAL | 2 refills | Status: DC

## 2020-12-04 MED ORDER — SPIRONOLACTONE 50 MG PO TABS: ORAL_TABLET | ORAL | 2 refills | Status: DC

## 2020-12-04 NOTE — Progress Notes (Signed)
F/u OV -  Diabetes Bilateral knee pain

## 2020-12-04 NOTE — Progress Notes (Signed)
Subjective:    Melanie Weaver - 80 y.o. female MRN [id number]  Date of birth: 1941-03-30  HPI  Melanie Weaver is here for follow up.  Diabetes mellitus, Type 2 Disease Monitoring             Blood Sugar Ranges: Fasting - Typically <120             Polyuria: no             Visual problems: no   Urine Microalbumin 18 (Oct 2021)--on Arb   Last A1C: 7.0 (Oct 2021)   Medications: Not on any currently. Discontinued Metformin in May 2021 due to A1c <7% and age >71.  Medication Compliance: N/A  Medication Side Effects             Hypoglycemia: no          Health Maintenance:  Health Maintenance Due  Topic Date Due  . Pneumococcal Vaccine 43-49 Years old (1 of 4 - PCV13) Never done  . Zoster Vaccines- Shingrix (1 of 2) Never done  . COVID-19 Vaccine (3 - Pfizer risk 4-dose series) 01/15/2020  . FOOT EXAM  04/16/2020    -  reports that she has been smoking cigarettes. She has a 20.00 pack-year smoking history. She has never used smokeless tobacco. - Review of Systems: Per HPI. - Past Medical History: Patient Active Problem List   Diagnosis Date Noted  . 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 Multiple lung nodules 09/28/2018  . LVH (left ventricular hypertrophy) 08/16/2018  . Parotid mass 05/10/2018  . Syncope 05/05/2018  . HTN (hypertension) 05/05/2018  . DM2 (diabetes mellitus, type 2) ([place]) 05/05/2018  . Centrilobular emphysema ([place]) 05/05/2018  . Dyspnea on exertion 03/01/2018  . Fatigue 01/05/2018  . Tobacco abuse 04/06/2017  . Hyperlipidemia 01/05/2017   - Medications: reviewed and updated   Objective:   Physical Exam BP (!) 151/62   Pulse 65   Resp 20   Wt 182 lb (82.6 kg)   SpO2 94%   BMI 32.76 kg/m  Physical Exam Constitutional:      General: She is not in acute distress.    Appearance: She is not diaphoretic.  HENT:     Head: Normocephalic and atraumatic.  Eyes:     Conjunctiva/sclera: Conjunctivae normal.  Cardiovascular:     Rate and Rhythm:  Normal rate and regular rhythm.     Heart sounds: Normal heart sounds. No murmur heard.   Pulmonary:     Effort: Pulmonary effort is normal. No respiratory distress.     Breath sounds: Normal breath sounds.  Musculoskeletal:        General: Normal range of motion.  Skin:    General: Skin is warm and dry.  Neurological:     Mental Status: She is alert and oriented to person, place, and time.  Psychiatric:        Mood and Affect: Affect normal.        Judgment: Judgment normal.            Assessment & Plan:   1. Type 2 diabetes mellitus with other circulatory complication, without long-term current use of insulin (HCC) A1c 7.3, at goal for age range. Continue with dietary control.  Counseled on Diabetic diet, my plate method, 846 minutes of moderate intensity exercise/week Blood sugar logs with fasting goals of 80-120 mg/dl, random of less than 180 and in the event of sugars less than 60 mg/dl or greater than 400 mg/dl encouraged to notify  the clinic. Advised on the need for annual eye exams, annual foot exams, Pneumonia vaccine. - HgB A1c - Glucose (CBG)  2. Essential hypertension BP slightly above goal but patient reports needing refills for medications. Refill and monitor. Asymptomatic.  - amLODipine (NORVASC) 10 MG tablet; TAKE 1 TABLET(10 MG) BY MOUTH DAILY  Dispense: 30 tablet; Refill: 2 - irbesartan (AVAPRO) 300 MG tablet; Take 1 tablet (300 mg total) by mouth daily.  Dispense: 30 tablet; Refill: 2 - metoprolol succinate (TOPROL-XL) 50 MG 24 hr tablet; Take 1 tablet (50 mg total) by mouth daily.  Dispense: 30 tablet; Refill: 2 - spironolactone (ALDACTONE) 50 MG tablet; TAKE 1 TABLET(50 MG) BY MOUTH DAILY  Dispense: 30 tablet; Refill: 2  3. Hyperlipidemia, unspecified hyperlipidemia type - pravastatin (PRAVACHOL) 80 MG tablet; TAKE 1 TABLET(80 MG) BY MOUTH EVERY EVENING  Dispense: 30 tablet; Refill: [place], D.O. 12/11/2020, 8:06 AM Primary Care at  [facility]

## 2021-02-03 ENCOUNTER — Telehealth: Payer: Self-pay | Admitting: Hematology and Oncology

## 2021-02-03 NOTE — Telephone Encounter (Signed)
Patient called to cancel 8/15 appt. Canceled per patient request

## 2021-02-17 ENCOUNTER — Other Ambulatory Visit: Payer: Medicare Other

## 2021-02-17 ENCOUNTER — Ambulatory Visit: Payer: Medicare Other | Admitting: Hematology and Oncology

## 2021-03-01 ENCOUNTER — Telehealth: Payer: Self-pay | Admitting: Internal Medicine

## 2021-03-01 NOTE — Telephone Encounter (Signed)
Attempted to call patient to schedule Medicare Wellness. Left msg. Unable to send mychart msg as patient does not have mychart. AS, CMA

## 2021-03-06 ENCOUNTER — Other Ambulatory Visit: Payer: Self-pay

## 2021-03-06 NOTE — Progress Notes (Addendum)
Patient ID: Melanie Weaver, female    DOB: 10/08/1940  MRN: [id number]  CC: Hypertension and Diabetes Follow-Up   Subjective: Analy Weaver is a 80 y.o. female who presents for hypertension and diabetes follow-up.  Her concerns today include:   HYPERTENSION FOLLOW-UP: 12/04/2020 per DO note: BP slightly above goal but patient reports needing refills for medications. Refill and monitor. Asymptomatic.   03/07/2021: Reports she has not taken Spironolactone for the day yet but plans to take once she returns home. Home blood pressures 130's-140's/80's-90's.Reports she separates taking blood pressure medications throughout the day because taking them all at once makes her sleepy. Endorses eating a diet high in salt. Denies shortness of breath and chest pain.   2. DIABETES TYPE 2 FOLLOW-UP: 12/04/2020 per DO note: A1c 7.3, at goal for age range. Continue with dietary control.   03/07/2021: Metformin discontinued May 2021 due to A1c <7% and age >80.   3. HYPERLIPIDEMIA FOLLOW-UP: 12/04/2020 per DO note: Pravastatin   03/07/2021: Doing well on Pravastatin.    Patient Active Problem List   Diagnosis Date Noted  . 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 Multiple lung nodules 09/28/2018  . LVH (left ventricular hypertrophy) 08/16/2018  . Parotid mass 05/10/2018  . Syncope 05/05/2018  . HTN (hypertension) 05/05/2018  . DM2 (diabetes mellitus, type 2) ([place]) 05/05/2018  . Centrilobular emphysema ([place]) 05/05/2018  . Dyspnea on exertion 03/01/2018  . Fatigue 01/05/2018  . Tobacco abuse 04/06/2017  . Hyperlipidemia 01/05/2017     Current Outpatient Medications on File Prior to Visit  Medication Sig Dispense Refill  . aspirin EC 81 MG tablet Take 81 mg by mouth daily.    . Multiple Vitamin (MULTIVITAMIN WITH MINERALS) TABS tablet Take 1 tablet by mouth daily.     No current facility-administered medications on file prior to visit.    Allergies  Allergen Reactions  . Amoxicillin Diarrhea     severe  . Maxzide [Hydrochlorothiazide W-Triamterene] Other (See Comments)    Dizziness    Social History   Socioeconomic History  . Marital status: Married    Spouse name: Not on file  . Number of children: Not on file  . Years of education: Not on file  . Highest education level: Not on file  Occupational History  . Not on file  Tobacco Use  . Smoking status: Every Day    Packs/day: 0.50    Years: 40.00    Pack years: 20.00    Types: Cigarettes  . Smokeless tobacco: Never  . Tobacco comments:     1-2 cigs per day  Vaping Use  . Vaping Use: Never used  Substance and Sexual Activity  . Alcohol use: No  . Drug use: No  . Sexual activity: Not Currently  Other Topics Concern  . Not on file  Social History Narrative   Lives with son with who is mentally handicapped.   Social Determinants of Health   Financial Resource Strain: Not on file  Food Insecurity: Not on file  Transportation Needs: Not on file  Physical Activity: Not on file  Stress: Not on file  Social Connections: Not on file  Intimate Partner Violence: Not on file    Family History  Problem Relation Age of Onset  . CVA Brother   . Diabetes Son   . Hypertension Son   . Mental retardation Son   . Stroke Son   . Diabetes Daughter     Past Surgical History:  Procedure Laterality Date  .  ABDOMINAL HYSTERECTOMY      ROS: Review of Systems Negative except as stated above  PHYSICAL EXAM: BP (!) 163/83 (BP Location: Left Arm, Patient Position: Sitting, Cuff Size: Normal)   Pulse 71   Temp 98 F (36.7 C)   Resp 18   Ht 5' 2.52" (1.588 m)   Wt 182 lb (82.6 kg)   SpO2 95%   BMI 32.74 kg/m   Physical Exam HENT:     Head: Normocephalic and atraumatic.  Eyes:     Extraocular Movements: Extraocular movements intact.     Conjunctiva/sclera: Conjunctivae normal.     Pupils: Pupils are equal, round, and reactive to light.  Cardiovascular:     Rate and Rhythm: Normal rate and regular rhythm.   Pulmonary:     Effort: Pulmonary effort is normal.     Breath sounds: Normal breath sounds.  Abdominal:     General: Bowel sounds are normal.     Palpations: Abdomen is soft.  Musculoskeletal:     Cervical back: Normal range of motion and neck supple.  Neurological:     General: No focal deficit present.     Mental Status: She is alert and oriented to person, place, and time.  Psychiatric:        Mood and Affect: Mood normal.        Behavior: Behavior normal.   ASSESSMENT AND PLAN: 1. Essential hypertension: - Blood pressure not at goal during today's visit. Patient asymptomatic without chest pressure, chest pain, palpitations, shortness of breath, and worst headache of life. - Home blood pressures 130's-140's/80's-90's. - Continue Amlodipine, Irbesartan, Metoprolol Succinate, and Spironolactone as prescribed.  - Counseled to take Spironolactone once she returns home. Patient agreeable.  - Counseled on blood pressure goal of less than 140/90, low-sodium, DASH diet, medication compliance, 150 minutes of moderate intensity exercise per week as tolerated. Discussed medication compliance, adverse effects. - Follow-up with primary provider in 1 week for blood pressure check or sooner if needed.  - amLODipine (NORVASC) 10 MG tablet; TAKE 1 TABLET(10 MG) BY MOUTH DAILY  Dispense: 90 tablet; Refill: 0 - irbesartan (AVAPRO) 300 MG tablet; Take 1 tablet (300 mg total) by mouth daily.  Dispense: 90 tablet; Refill: 0 - metoprolol succinate (TOPROL-XL) 50 MG 24 hr tablet; Take 1 tablet (50 mg total) by mouth daily.  Dispense: 90 tablet; Refill: 0 - spironolactone (ALDACTONE) 50 MG tablet; TAKE 1 TABLET(50 MG) BY MOUTH DAILY  Dispense: 90 tablet; Refill: 0  2. Type 2 diabetes mellitus with other circulatory complication, without long-term current use of insulin ([place]): - Discontinued Metformin in May 2021 due to A1c <7% and age >3.  - Repeat hemoglobin A1c today to screen for level of diabetes  control. - Follow-up with primary provider as scheduled.  - Hemoglobin A1c  3. Hyperlipidemia, unspecified hyperlipidemia type: -Practice low-fat heart healthy diet and at least 150 minutes of moderate intensity exercise weekly as tolerated.  - Continue Pravastatin as prescribed.  - Follow-up with primary provider as scheduled. - pravastatin (PRAVACHOL) 80 MG tablet; TAKE 1 TABLET(80 MG) BY MOUTH EVERY EVENING  Dispense: 90 tablet; Refill: 0    Patient was given the opportunity to ask questions.  Patient verbalized understanding of the plan and was able to repeat key elements of the plan. Patient was given clear instructions to go to Emergency Department or return to medical center if symptoms don't improve, worsen, or new problems develop.The patient verbalized understanding.   Orders Placed This Encounter  Procedures  .  Hemoglobin A1c     Requested Prescriptions   Signed Prescriptions Disp Refills  . amLODipine (NORVASC) 10 MG tablet 90 tablet 0    Sig: TAKE 1 TABLET(10 MG) BY MOUTH DAILY  . irbesartan (AVAPRO) 300 MG tablet 90 tablet 0    Sig: Take 1 tablet (300 mg total) by mouth daily.  . metoprolol succinate (TOPROL-XL) 50 MG 24 hr tablet 90 tablet 0    Sig: Take 1 tablet (50 mg total) by mouth daily.  Marland Kitchen spironolactone (ALDACTONE) 50 MG tablet 90 tablet 0    Sig: TAKE 1 TABLET(50 MG) BY MOUTH DAILY  . pravastatin (PRAVACHOL) 80 MG tablet 90 tablet 0    Sig: TAKE 1 TABLET(80 MG) BY MOUTH EVERY EVENING    Return in about 1 week (around 03/14/2021) for hypertension .  Camillia Herter, NP

## 2021-03-07 ENCOUNTER — Encounter: Payer: Self-pay | Admitting: Family

## 2021-03-07 ENCOUNTER — Ambulatory Visit (INDEPENDENT_AMBULATORY_CARE_PROVIDER_SITE_OTHER): Payer: Medicare Other | Admitting: Family

## 2021-03-07 ENCOUNTER — Other Ambulatory Visit: Payer: Self-pay

## 2021-03-07 VITALS — BP 163/83 | HR 71 | Temp 98.0°F | Resp 18 | Ht 62.52 in | Wt 182.0 lb

## 2021-03-07 DIAGNOSIS — I10 Essential (primary) hypertension: Secondary | ICD-10-CM | POA: Diagnosis not present

## 2021-03-07 DIAGNOSIS — E785 Hyperlipidemia, unspecified: Secondary | ICD-10-CM | POA: Diagnosis not present

## 2021-03-07 DIAGNOSIS — E1159 Type 2 diabetes mellitus with other circulatory complications: Secondary | ICD-10-CM | POA: Diagnosis not present

## 2021-03-07 MED ORDER — PRAVASTATIN SODIUM 80 MG PO TABS: ORAL_TABLET | ORAL | 0 refills | Status: DC

## 2021-03-07 MED ORDER — IRBESARTAN 300 MG PO TABS: 300.0000 mg | ORAL_TABLET | Freq: Every day | ORAL | 0 refills | Status: AC

## 2021-03-07 MED ORDER — METOPROLOL SUCCINATE ER 50 MG PO TB24: 50.0000 mg | ORAL_TABLET | Freq: Every day | ORAL | 0 refills | Status: DC

## 2021-03-07 MED ORDER — AMLODIPINE BESYLATE 10 MG PO TABS: ORAL_TABLET | ORAL | 0 refills | Status: DC

## 2021-03-07 MED ORDER — SPIRONOLACTONE 50 MG PO TABS: ORAL_TABLET | ORAL | 0 refills | Status: DC

## 2021-03-07 NOTE — Progress Notes (Signed)
Pt presents for hypertension and diabetes follow-up, has not other concerns

## 2021-03-08 LAB — HEMOGLOBIN A1C
Est. average glucose Bld gHb Est-mCnc: 148 mg/dL
Hgb A1c MFr Bld: 6.8 % — ABNORMAL HIGH (ref 4.8–5.6)

## 2021-03-08 NOTE — Progress Notes (Signed)
Please call patient with update.   Hemoglobin A1c improved since 3 months ago. We will continue with the plan discussed in office.

## 2021-03-30 ENCOUNTER — Ambulatory Visit (INDEPENDENT_AMBULATORY_CARE_PROVIDER_SITE_OTHER): Payer: Medicare Other

## 2021-03-30 ENCOUNTER — Other Ambulatory Visit: Payer: Self-pay

## 2021-03-30 DIAGNOSIS — Z0000 Encounter for general adult medical examination without abnormal findings: Secondary | ICD-10-CM

## 2021-03-30 NOTE — Progress Notes (Signed)
Subjective:   Melanie Weaver is a 80 y.o. female who presents for an Initial Medicare Annual Wellness Visit.   I connected with  [place] on 03/30/21 by a audio enabled telemedicine application and verified that I am speaking with the correct person using two identifiers.   I discussed the limitations of evaluation and management by telemedicine. The patient expressed understanding and agreed to proceed.  Location of Patient: Home Location of Provider: Office  Review of Systems    Defer to PCP       Objective:    There were no vitals filed for this visit. There is no height or weight on file to calculate BMI.  Advanced Directives 05/09/2018 05/06/2018 05/05/2018  Does Patient Have a Medical Advance Directive? Yes No Yes  Type of Paramedic of [place];Living will [place];Living will [place];Living will  Does patient want to make changes to medical advance directive? No - Patient declined - -  Copy of [place] in Chart? No - copy requested No - copy requested -  Would patient like information on creating a medical advance directive? No - Patient declined No - Patient declined -    Current Medications (verified) Outpatient Encounter Medications as of 03/30/2021  Medication Sig   amLODipine (NORVASC) 10 MG tablet TAKE 1 TABLET(10 MG) BY MOUTH DAILY   aspirin EC 81 MG tablet Take 81 mg by mouth daily.   irbesartan (AVAPRO) 300 MG tablet Take 1 tablet (300 mg total) by mouth daily.   metoprolol succinate (TOPROL-XL) 50 MG 24 hr tablet Take 1 tablet (50 mg total) by mouth daily.   Multiple Vitamin (MULTIVITAMIN WITH MINERALS) TABS tablet Take 1 tablet by mouth daily.   pravastatin (PRAVACHOL) 80 MG tablet TAKE 1 TABLET(80 MG) BY MOUTH EVERY EVENING   spironolactone (ALDACTONE) 50 MG tablet TAKE 1 TABLET(50 MG) BY MOUTH DAILY   No facility-administered encounter medications on file as of  03/30/2021.    Allergies (verified) Amoxicillin and Maxzide [hydrochlorothiazide w-triamterene]   History: Past Medical History:  Diagnosis Date   Essential hypertension    Mixed hyperlipidemia    Parotid mass    Pulmonary nodule    Type 2 diabetes mellitus without complication, without long-term current use of insulin (HCC)    Past Surgical History:  Procedure Laterality Date   ABDOMINAL HYSTERECTOMY     Family History  Problem Relation Age of Onset   CVA Brother    Diabetes Son    Hypertension Son    Mental retardation Son    Stroke Son    Diabetes Daughter    Social History   Socioeconomic History   Marital status: Married    Spouse name: Not on file   Number of children: Not on file   Years of education: Not on file   Highest education level: Not on file  Occupational History   Not on file  Tobacco Use   Smoking status: Every Day    Packs/day: 0.50    Years: 40.00    Pack years: 20.00    Types: Cigarettes   Smokeless tobacco: Never   Tobacco comments:     1-2 cigs per day  Vaping Use   Vaping Use: Never used  Substance and Sexual Activity   Alcohol use: No   Drug use: No   Sexual activity: Not Currently  Other Topics Concern   Not on file  Social History Narrative   Lives with  son with who is mentally handicapped.   Social Determinants of Health   Financial Resource Strain: Not on file  Food Insecurity: Not on file  Transportation Needs: Not on file  Physical Activity: Not on file  Stress: Not on file  Social Connections: Not on file    Tobacco Counseling Ready to quit: Not Answered Counseling given: Not Answered Tobacco comments:  1-2 cigs per day   Clinical Intake:                 Diabetic? Yes          Activities of Daily Living In your present state of health, do you have any difficulty performing the following activities: 03/07/2021  Hearing? N  Vision? N  Difficulty concentrating or making decisions? N  Walking  or climbing stairs? N  Dressing or bathing? N  Doing errands, shopping? N  Some recent data might be hidden    Patient Care Team: Pcp, No as PCP - General  Indicate any recent Medical Services you may have received from other than [facility] providers in the past year (date may be approximate).     Assessment:   This is a routine wellness examination for Melanie Weaver.  Hearing/Vision screen No results found.  Dietary issues and exercise activities discussed:     Goals Addressed   None   Depression Screen PHQ 2/9 Scores 12/04/2020 08/22/2019 08/11/2018  PHQ - 2 Score 0 0 2  PHQ- 9 Score 0 - 6    Fall Risk Fall Risk  12/04/2020  Falls in the past year? 1  Number falls in past yr: 0  Injury with Fall? 1  Risk for fall due to : Impaired balance/gait  Follow up Falls evaluation completed    FALL RISK PREVENTION PERTAINING TO THE HOME:  Any stairs in or around the home? Yes  If so, are there any without handrails? No  Home free of loose throw rugs in walkways, pet beds, electrical cords, etc? Yes  Adequate lighting in your home to reduce risk of falls? Yes   ASSISTIVE DEVICES UTILIZED TO PREVENT FALLS:  Life alert? No  Use of a cane, walker or w/c? Yes  Grab bars in the bathroom? No  Shower chair or bench in shower? Yes  Elevated toilet seat or a handicapped toilet? No   TIMED UP AND GO:  Was the test performed?  N/A .  Length of time to ambulate: N/A  sec.     Cognitive Function: MMSE - Mini Mental State Exam 12/01/2018  Orientation to time 5  Orientation to Place 5  Registration 3  Attention/ Calculation 4  Recall 2  Language- name 2 objects 2  Language- repeat 1  Language- follow 3 step command 3  Language- read & follow direction 1  Write a sentence 1  Copy design 0  Total score 27        Immunizations Immunization History  Administered Date(s) Administered   PFIZER(Purple Top)SARS-COV-2 Vaccination 11/27/2019, 12/18/2019   Pneumococcal Conjugate-13  04/06/2017, 10/18/2017   Pneumococcal Polysaccharide-23 05/10/2018    TDAP status: Up to date  Flu Vaccine status: Declined, Education has been provided regarding the importance of this vaccine but patient still declined. Advised may receive this vaccine at local pharmacy or Health Dept. Aware to provide a copy of the vaccination record if obtained from local pharmacy or Health Dept. Verbalized acceptance and understanding.  Pneumococcal vaccine status: Up to date  Covid-19 vaccine status: Completed vaccines  Qualifies for Shingles  Vaccine? No   Zostavax completed No   Shingrix Completed?: No.    Education has been provided regarding the importance of this vaccine. Patient has been advised to call insurance company to determine out of pocket expense if they have not yet received this vaccine. Advised may also receive vaccine at local pharmacy or Health Dept. Verbalized acceptance and understanding.  Screening Tests Health Maintenance  Topic Date Due   Zoster Vaccines- Shingrix (1 of 2) Never done   COVID-19 Vaccine (3 - Pfizer risk series) 01/15/2020   FOOT EXAM  04/16/2020   OPHTHALMOLOGY EXAM  01/21/2021   INFLUENZA VACCINE  Never done   HEMOGLOBIN A1C  09/04/2021   TETANUS/TDAP  07/06/2025   DEXA SCAN  Completed   HPV VACCINES  Aged Out    Health Maintenance  Health Maintenance Due  Topic Date Due   Zoster Vaccines- Shingrix (1 of 2) Never done   COVID-19 Vaccine (3 - Pfizer risk series) 01/15/2020   FOOT EXAM  04/16/2020   OPHTHALMOLOGY EXAM  01/21/2021   INFLUENZA VACCINE  Never done    Colorectal cancer screening: No longer required.   Mammogram status: No longer required due to Aged out.  Bone Density status: Completed 10/07/2016 . Results reflect: Bone density results: NORMAL. Repeat every 5 years. Pt reported.   Lung Cancer Screening: (Low Dose CT Chest recommended if Age 63-80 years, 30 pack-year currently smoking OR have quit w/in 15years.) does not qualify.    Lung Cancer Screening Referral: Aged Out  Additional Screening:  Hepatitis C Screening: does not qualify; aged out  Vision Screening: Recommended annual ophthalmology exams for early detection of glaucoma and other disorders of the eye. Is the patient up to date with their annual eye exam?  No  Who is the provider or what is the name of the office in which the patient attends annual eye exams? Dr. Rutherford Guys at [facility]; [facility]. If pt is not established with a provider, would they like to be referred to a provider to establish care? No . Established  Dental Screening: Recommended annual dental exams for proper oral hygiene  Community Resource Referral / Chronic Care Management: CRR required this visit?  No   CCM required this visit?  No      Plan:     I have personally reviewed and noted the following in the patient's chart:   Medical and social history Use of alcohol, tobacco or illicit drugs  Current medications and supplements including opioid prescriptions. Patient is not currently taking opioid prescriptions. Functional ability and status Nutritional status Physical activity Advanced directives List of other physicians Hospitalizations, surgeries, and ER visits in previous 12 months Vitals Screenings to include cognitive, depression, and falls Referrals and appointments  In addition, I have reviewed and discussed with patient certain preventive protocols, quality metrics, and best practice recommendations. A written personalized care plan for preventive services as well as general preventive health recommendations were provided to patient.     Tobe Sos, [place]   03/30/2021   Nurse Notes: Non face to face- 60 min.   Melanie Weaver , Thank you for taking time to come for your Medicare Wellness Visit. I appreciate your ongoing commitment to your health goals. Please review the following plan we discussed and let me know if I can assist you  in the future.   These are the goals we discussed:  Goals   None     This is a list of  the screening recommended for you and due dates:  Health Maintenance  Topic Date Due   Zoster (Shingles) Vaccine (1 of 2) Never done   COVID-19 Vaccine (3 - Pfizer risk series) 01/15/2020   Complete foot exam   04/16/2020   Eye exam for diabetics  01/21/2021   Flu Shot  Never done   Hemoglobin A1C  09/04/2021   Tetanus Vaccine  07/06/2025   DEXA scan (bone density measurement)  Completed   HPV Vaccine  Aged Out

## 2021-04-01 ENCOUNTER — Ambulatory Visit (INDEPENDENT_AMBULATORY_CARE_PROVIDER_SITE_OTHER): Payer: Medicare Other | Admitting: Family Medicine

## 2021-04-01 ENCOUNTER — Encounter: Payer: Self-pay | Admitting: Family Medicine

## 2021-04-01 ENCOUNTER — Other Ambulatory Visit: Payer: Self-pay

## 2021-04-01 VITALS — BP 150/72 | HR 68 | Temp 98.2°F | Resp 16 | Ht 62.0 in | Wt 184.6 lb

## 2021-04-01 DIAGNOSIS — E1159 Type 2 diabetes mellitus with other circulatory complications: Secondary | ICD-10-CM | POA: Diagnosis not present

## 2021-04-01 DIAGNOSIS — Z87891 Personal history of nicotine dependence: Secondary | ICD-10-CM

## 2021-04-01 DIAGNOSIS — E1169 Type 2 diabetes mellitus with other specified complication: Secondary | ICD-10-CM

## 2021-04-01 DIAGNOSIS — E785 Hyperlipidemia, unspecified: Secondary | ICD-10-CM

## 2021-04-01 DIAGNOSIS — I10 Essential (primary) hypertension: Secondary | ICD-10-CM

## 2021-04-01 NOTE — Progress Notes (Signed)
Patient is new to provider.Patient has no new concerns

## 2021-04-02 NOTE — Progress Notes (Signed)
Established Patient Office Visit  Subjective:  Patient ID: Melanie Weaver, female    DOB: Oct 14, 1940  Age: 80 y.o. MRN: [id number]  CC:  Chief Complaint  Patient presents with   Establish Care   Follow-up    HPI Melanie Weaver presents for follow-up of chronic medical issues including diabetes and hypertension.  Patient denies acute complaints or concerns on today.  Past Medical History:  Diagnosis Date   Essential hypertension    Mixed hyperlipidemia    Parotid mass    Pulmonary nodule    Type 2 diabetes mellitus without complication, without long-term current use of insulin (HCC)       Social History   Socioeconomic History   Marital status: Married    Spouse name: Not on file   Number of children: Not on file   Years of education: Not on file   Highest education level: Not on file  Occupational History   Not on file  Tobacco Use   Smoking status: Former    Packs/day: 0.50    Years: 40.00    Pack years: 20.00    Types: Cigarettes    Quit date: 12/04/2020    Years since quitting: 0.3   Smokeless tobacco: Never   Tobacco comments:     1-2 cigs per day  Vaping Use   Vaping Use: Never used  Substance and Sexual Activity   Alcohol use: No   Drug use: No   Sexual activity: Not Currently  Other Topics Concern   Not on file  Social History Narrative   Lives with son with who is mentally handicapped.   Social Determinants of Health   Financial Resource Strain: Low Risk    Difficulty of Paying Living Expenses: Not very hard  Food Insecurity: No Food Insecurity   Worried About Charity fundraiser in the Last Year: Never true   Ran Out of Food in the Last Year: Never true  Transportation Needs: No Transportation Needs   Lack of Transportation (Medical): No   Lack of Transportation (Non-Medical): No  Physical Activity: Insufficiently Active   Days of Exercise per Week: 3 days   Minutes of Exercise per Session: 10 min  Stress: No Stress Concern Present    Feeling of Stress : Not at all  Social Connections: Not on file  Intimate Partner Violence: Not on file    ROS Review of Systems  All other systems reviewed and are negative.  Objective:   Today's Vitals: BP (!) 150/72 (BP Location: Right Arm, Patient Position: Sitting, Cuff Size: Large)   Pulse 68   Temp 98.2 F (36.8 C) (Oral)   Resp 16   Ht 5\' 2"  (1.575 m)   Wt 184 lb 9.6 oz (83.7 kg)   SpO2 94%   BMI 33.76 kg/m   Physical Exam Vitals and nursing note reviewed.  Constitutional:      General: She is not in acute distress. Cardiovascular:     Rate and Rhythm: Normal rate and regular rhythm.  Pulmonary:     Effort: Pulmonary effort is normal.     Breath sounds: Normal breath sounds.  Abdominal:     Palpations: Abdomen is soft.     Tenderness: There is no abdominal tenderness.  Neurological:     General: No focal deficit present.     Mental Status: She is alert and oriented to person, place, and time.    Assessment & Plan:   1. Essential hypertension Slightly elevated reading.  Patient deferred  any additional agents at this time.  Will monitor.  2. Type 2 diabetes mellitus with other circulatory complication, without long-term current use of insulin (HCC) Recent A1c was at goal.  Continue present management.  3. Hyperlipidemia associated with type 2 diabetes mellitus ([place]) Continue present management.  4. Stopped smoking between 3 and 6 months ago Encouraged continued cessation.    Outpatient Encounter Medications as of 04/01/2021  Medication Sig   amLODipine (NORVASC) 10 MG tablet TAKE 1 TABLET(10 MG) BY MOUTH DAILY   aspirin EC 81 MG tablet Take 81 mg by mouth daily.   irbesartan (AVAPRO) 300 MG tablet Take 1 tablet (300 mg total) by mouth daily.   metoprolol succinate (TOPROL-XL) 50 MG 24 hr tablet Take 1 tablet (50 mg total) by mouth daily.   Multiple Vitamin (MULTIVITAMIN WITH MINERALS) TABS tablet Take 1 tablet by mouth daily.   pravastatin  (PRAVACHOL) 80 MG tablet TAKE 1 TABLET(80 MG) BY MOUTH EVERY EVENING   spironolactone (ALDACTONE) 50 MG tablet TAKE 1 TABLET(50 MG) BY MOUTH DAILY   No facility-administered encounter medications on file as of 04/01/2021.    Follow-up: Return in about 5 months (around 09/01/2021) for follow up, chronic med issues.   Melanie Sax, MD

## 2021-08-06 ENCOUNTER — Telehealth: Payer: Self-pay | Admitting: Family Medicine

## 2021-08-06 ENCOUNTER — Other Ambulatory Visit: Payer: Self-pay | Admitting: *Deleted

## 2021-08-06 DIAGNOSIS — I10 Essential (primary) hypertension: Secondary | ICD-10-CM

## 2021-08-06 MED ORDER — METOPROLOL SUCCINATE ER 50 MG PO TB24: 50.0000 mg | ORAL_TABLET | Freq: Every day | ORAL | 0 refills | Status: DC

## 2021-08-06 MED ORDER — SPIRONOLACTONE 50 MG PO TABS: ORAL_TABLET | ORAL | 0 refills | Status: DC

## 2021-08-06 NOTE — Telephone Encounter (Signed)
spironolactone (ALDACTONE) 50 MG tablet [[id number]]    metoprolol succinate (TOPROL-XL) 50 MG 24 hr tablet [[id number]]     Pharmacy  [facility] [id number], [address] AT [address]  [place], [place] [address]  Phone:  [phone]  Fax:  [phone]

## 2021-09-01 ENCOUNTER — Encounter (INDEPENDENT_AMBULATORY_CARE_PROVIDER_SITE_OTHER): Payer: Self-pay

## 2021-09-01 ENCOUNTER — Other Ambulatory Visit: Payer: Self-pay

## 2021-09-01 ENCOUNTER — Encounter: Payer: Self-pay | Admitting: Family Medicine

## 2021-09-01 ENCOUNTER — Ambulatory Visit (INDEPENDENT_AMBULATORY_CARE_PROVIDER_SITE_OTHER): Payer: Medicare Other | Admitting: Family Medicine

## 2021-09-01 VITALS — BP 158/76 | HR 86 | Temp 98.1°F | Resp 16 | Wt 187.4 lb

## 2021-09-01 DIAGNOSIS — E7849 Other hyperlipidemia: Secondary | ICD-10-CM

## 2021-09-01 DIAGNOSIS — I10 Essential (primary) hypertension: Secondary | ICD-10-CM | POA: Diagnosis not present

## 2021-09-01 NOTE — Progress Notes (Signed)
Patient is here for f/up HTN. Patient would like to talk about her medication metoprolol warnings on the bottle

## 2021-09-03 ENCOUNTER — Encounter: Payer: Self-pay | Admitting: Family Medicine

## 2021-09-03 NOTE — Progress Notes (Signed)
Established Patient Office Visit  Subjective:  Patient ID: Melanie Weaver, female    DOB: [birth year]  Age: 81 y.o. MRN: [id number]  CC:  Chief Complaint  Patient presents with   Follow-up   Hypertension    HPI [place] presents for follow up of hypertension. Denies acute complaints.   Past Medical History:  Diagnosis Date   Essential hypertension    Mixed hyperlipidemia    Parotid mass    Pulmonary nodule    Type 2 diabetes mellitus without complication, without long-term current use of insulin (HCC)     Past Surgical History:  Procedure Laterality Date   ABDOMINAL HYSTERECTOMY      Family History  Problem Relation Age of Onset   CVA Brother    Diabetes Son    Hypertension Son    Mental retardation Son    Stroke Son    Diabetes Daughter     Social History   Socioeconomic History   Marital status: Married    Spouse name: Not on file   Number of children: Not on file   Years of education: Not on file   Highest education level: Not on file  Occupational History   Not on file  Tobacco Use   Smoking status: Former    Packs/day: 0.50    Years: 40.00    Pack years: 20.00    Types: Cigarettes    Quit date: 12/04/2020    Years since quitting: 0.7   Smokeless tobacco: Never   Tobacco comments:     1-2 cigs per day  Vaping Use   Vaping Use: Never used  Substance and Sexual Activity   Alcohol use: No   Drug use: No   Sexual activity: Not Currently  Other Topics Concern   Not on file  Social History Narrative   Lives with son with who is mentally handicapped.   Social Determinants of Health   Financial Resource Strain: Low Risk    Difficulty of Paying Living Expenses: Not very hard  Food Insecurity: No Food Insecurity   Worried About Charity fundraiser in the Last Year: Never true   Ran Out of Food in the Last Year: Never true  Transportation Needs: No Transportation Needs   Lack of Transportation (Medical): No   Lack of Transportation  (Non-Medical): No  Physical Activity: Insufficiently Active   Days of Exercise per Week: 3 days   Minutes of Exercise per Session: 10 min  Stress: No Stress Concern Present   Feeling of Stress : Not at all  Social Connections: Not on file  Intimate Partner Violence: Not on file    ROS Review of Systems  All other systems reviewed and are negative.  Objective:   Today's Vitals: BP (!) 158/76    Pulse 86    Temp 98.1 F (36.7 C) (Oral)    Resp 16    Wt 187 lb 6.4 oz (85 kg)    SpO2 97%    BMI 34.28 kg/m   Physical Exam Vitals and nursing note reviewed.  Constitutional:      General: She is not in acute distress. Cardiovascular:     Rate and Rhythm: Normal rate and regular rhythm.  Pulmonary:     Effort: Pulmonary effort is normal.     Breath sounds: Normal breath sounds.  Abdominal:     Palpations: Abdomen is soft.     Tenderness: There is no abdominal tenderness.  Neurological:     General: No focal deficit  present.     Mental Status: She is alert and oriented to person, place, and time.    Assessment & Plan:  1. Primary hypertension Slightly elevated readings. Discussed compliance. Deferred changes in present regimen. Continue and monitor  2. Other hyperlipidemia Continue present management   Outpatient Encounter Medications as of 09/01/2021  Medication Sig   amLODipine (NORVASC) 10 MG tablet TAKE 1 TABLET(10 MG) BY MOUTH DAILY   aspirin EC 81 MG tablet Take 81 mg by mouth daily.   irbesartan (AVAPRO) 300 MG tablet Take 1 tablet (300 mg total) by mouth daily.   metoprolol succinate (TOPROL-XL) 50 MG 24 hr tablet Take 1 tablet (50 mg total) by mouth daily.   Multiple Vitamin (MULTIVITAMIN WITH MINERALS) TABS tablet Take 1 tablet by mouth daily.   pravastatin (PRAVACHOL) 80 MG tablet TAKE 1 TABLET(80 MG) BY MOUTH EVERY EVENING   spironolactone (ALDACTONE) 50 MG tablet TAKE 1 TABLET(50 MG) BY MOUTH DAILY   No facility-administered encounter medications on file as of  09/01/2021.    Follow-up: No follow-ups on file.   Becky Sax, MD

## 2021-11-07 ENCOUNTER — Other Ambulatory Visit: Payer: Self-pay | Admitting: Family Medicine

## 2021-11-07 DIAGNOSIS — I10 Essential (primary) hypertension: Secondary | ICD-10-CM

## 2021-11-07 MED ORDER — SPIRONOLACTONE 50 MG PO TABS: ORAL_TABLET | ORAL | 0 refills | Status: DC

## 2021-11-07 NOTE — Telephone Encounter (Signed)
Medication Refill - Medication: spironolactone (ALDACTONE) 50 MG tablet ? ?Has the patient contacted their pharmacy? Yes.   No, more refills.  ? ?(Agent: If yes, when and what did the pharmacy advise?) ? ?Preferred Pharmacy (with phone number or street name):  ?[facility] [address], [address] [place]  ?[place] [address]  ?Phone: [phone] Fax: [phone]  ?Hours: Not open 24 hours  ? ?Has the patient been seen for an appointment in the last year OR does the patient have an upcoming appointment? Yes.   ? ?Agent: Please be advised that RX refills may take up to 3 business days. We ask that you follow-up with your pharmacy.  ?

## 2021-11-07 NOTE — Telephone Encounter (Signed)
Last labs 08/14/20 future visit in 3 weeks.  ?Requested Prescriptions  ?Pending Prescriptions Disp Refills  ?? spironolactone (ALDACTONE) 50 MG tablet 90 tablet 0  ?  Sig: TAKE 1 TABLET(50 MG) BY MOUTH DAILY  ?  ? Cardiovascular: Diuretics - Aldosterone Antagonist Failed - 11/07/2021  4:13 PM  ?  ?  Failed - Cr in normal range and within 180 days  ?  Creatinine  ?Date Value Ref Range Status  ?08/14/2020 0.99 0.44 - 1.00 mg/dL Final  ?   ?  ?  Failed - K in normal range and within 180 days  ?  Potassium  ?Date Value Ref Range Status  ?08/14/2020 3.8 3.5 - 5.1 mmol/L Final  ?   ?  ?  Failed - Na in normal range and within 180 days  ?  Sodium  ?Date Value Ref Range Status  ?08/14/2020 139 135 - 145 mmol/L Final  ?04/19/2020 139 134 - 144 mmol/L Final  ?   ?  ?  Failed - eGFR is 30 or above and within 180 days  ?  GFR calc Af Amer  ?Date Value Ref Range Status  ?04/19/2020 74 >59 mL/min/1.73 Final  ?  Comment:  ?  **In accordance with recommendations from the NKF-ASN Task force,** ?  [facility] is in the process of updating its eGFR calculation to the ?  2021 CKD-EPI creatinine equation that estimates kidney function ?  without a race variable. ?  ? ?GFR, Estimated  ?Date Value Ref Range Status  ?08/14/2020 58 (L) >60 mL/min Final  ?  Comment:  ?  (NOTE) ?Calculated using the CKD-EPI Creatinine Equation (2021) ?  ?   ?  ?  Failed - Last BP in normal range  ?  BP Readings from Last 1 Encounters:  ?09/01/21 (!) 158/76  ?   ?  ?  Passed - Valid encounter within last 6 months  ?  Recent Outpatient Visits   ?      ? 2 months ago Primary hypertension  ? Primary Care at [facility], MD  ? 7 months ago Essential hypertension  ? Primary Care at [facility], MD  ? 8 months ago Essential hypertension  ? Primary Care at [facility], Amy J, NP  ? 11 months ago Type 2 diabetes mellitus with other circulatory complication, without long-term current use of insulin ([place])  ? Primary Care at  [facility], Bayard Beaver, MD  ? 1 year ago Type 2 diabetes mellitus with other circulatory complication, without long-term current use of insulin ([place])  ? Primary Care at [facility], Bayard Beaver, MD  ?  ?  ?Future Appointments   ?        ? In 3 weeks Dorna Mai, MD Primary Care at [facility]  ?  ? ?  ?  ?  ? ?

## 2021-12-03 ENCOUNTER — Ambulatory Visit (INDEPENDENT_AMBULATORY_CARE_PROVIDER_SITE_OTHER): Payer: Medicare Other | Admitting: Family Medicine

## 2021-12-03 ENCOUNTER — Encounter: Payer: Self-pay | Admitting: Family Medicine

## 2021-12-03 DIAGNOSIS — E785 Hyperlipidemia, unspecified: Secondary | ICD-10-CM

## 2021-12-03 DIAGNOSIS — I10 Essential (primary) hypertension: Secondary | ICD-10-CM | POA: Diagnosis not present

## 2021-12-03 MED ORDER — PRAVASTATIN SODIUM 80 MG PO TABS: ORAL_TABLET | ORAL | 1 refills | Status: DC

## 2021-12-03 MED ORDER — AMLODIPINE BESYLATE 10 MG PO TABS: ORAL_TABLET | ORAL | 1 refills | Status: DC

## 2021-12-04 ENCOUNTER — Encounter: Payer: Self-pay | Admitting: Family Medicine

## 2021-12-04 NOTE — Progress Notes (Signed)
Established Patient Office Visit  Subjective    Patient ID: Melanie Weaver, female    DOB: [birth year]  Age: 81 y.o. MRN: [id number]  CC:  Chief Complaint  Patient presents with   Follow-up   Hypertension    HPI [place] presents for routine follow upof chronic med issues that includes hypertension. She denies acute compliants or concerns.    Outpatient Encounter Medications as of 12/03/2021  Medication Sig   amLODipine (NORVASC) 10 MG tablet TAKE 1 TABLET(10 MG) BY MOUTH DAILY   aspirin EC 81 MG tablet Take 81 mg by mouth daily.   irbesartan (AVAPRO) 300 MG tablet Take 1 tablet (300 mg total) by mouth daily.   metoprolol succinate (TOPROL-XL) 50 MG 24 hr tablet Take 1 tablet (50 mg total) by mouth daily.   Multiple Vitamin (MULTIVITAMIN WITH MINERALS) TABS tablet Take 1 tablet by mouth daily.   pravastatin (PRAVACHOL) 80 MG tablet TAKE 1 TABLET(80 MG) BY MOUTH EVERY EVENING   spironolactone (ALDACTONE) 50 MG tablet TAKE 1 TABLET(50 MG) BY MOUTH DAILY   [DISCONTINUED] amLODipine (NORVASC) 10 MG tablet TAKE 1 TABLET(10 MG) BY MOUTH DAILY   [DISCONTINUED] pravastatin (PRAVACHOL) 80 MG tablet TAKE 1 TABLET(80 MG) BY MOUTH EVERY EVENING   No facility-administered encounter medications on file as of 12/03/2021.    Past Medical History:  Diagnosis Date   Essential hypertension    Mixed hyperlipidemia    Parotid mass    Pulmonary nodule    Type 2 diabetes mellitus without complication, without long-term current use of insulin (HCC)     Past Surgical History:  Procedure Laterality Date   ABDOMINAL HYSTERECTOMY      Family History  Problem Relation Age of Onset   CVA Brother    Diabetes Son    Hypertension Son    Mental retardation Son    Stroke Son    Diabetes Daughter     Social History   Socioeconomic History   Marital status: Married    Spouse name: Not on file   Number of children: Not on file   Years of education: Not on file   Highest education level:  Not on file  Occupational History   Not on file  Tobacco Use   Smoking status: Former    Packs/day: 0.50    Years: 40.00    Pack years: 20.00    Types: Cigarettes    Quit date: 12/04/2020    Years since quitting: 1.0   Smokeless tobacco: Never   Tobacco comments:     1-2 cigs per day  Vaping Use   Vaping Use: Never used  Substance and Sexual Activity   Alcohol use: No   Drug use: No   Sexual activity: Not Currently  Other Topics Concern   Not on file  Social History Narrative   Lives with son with who is mentally handicapped.   Social Determinants of Health   Financial Resource Strain: Low Risk    Difficulty of Paying Living Expenses: Not very hard  Food Insecurity: No Food Insecurity   Worried About Charity fundraiser in the Last Year: Never true   Ran Out of Food in the Last Year: Never true  Transportation Needs: No Transportation Needs   Lack of Transportation (Medical): No   Lack of Transportation (Non-Medical): No  Physical Activity: Insufficiently Active   Days of Exercise per Week: 3 days   Minutes of Exercise per Session: 10 min  Stress: No Stress Concern Present  Feeling of Stress : Not at all  Social Connections: Not on file  Intimate Partner Violence: Not on file    Review of Systems  All other systems reviewed and are negative.      Objective    BP (!) 148/77   Pulse 74   Temp 98.1 F (36.7 C) (Oral)   Resp 16   Wt 187 lb (84.8 kg)   SpO2 93%   BMI 34.20 kg/m   Physical Exam Vitals and nursing note reviewed.  Constitutional:      General: She is not in acute distress. Cardiovascular:     Rate and Rhythm: Normal rate and regular rhythm.  Pulmonary:     Effort: Pulmonary effort is normal.     Breath sounds: Normal breath sounds.  Abdominal:     Palpations: Abdomen is soft.     Tenderness: There is no abdominal tenderness.  Musculoskeletal:     Right lower leg: No edema.     Left lower leg: No edema.     Comments: Patient  utilizing a cane for stability  Neurological:     General: No focal deficit present.     Mental Status: She is alert and oriented to person, place, and time.        Assessment & Plan:   1. Essential hypertension Will continue present management. Meds refilled.  - amLODipine (NORVASC) 10 MG tablet; TAKE 1 TABLET(10 MG) BY MOUTH DAILY  Dispense: 90 tablet; Refill: 1  2. Hyperlipidemia, unspecified hyperlipidemia type Continue present management. Meds refilled.  - pravastatin (PRAVACHOL) 80 MG tablet; TAKE 1 TABLET(80 MG) BY MOUTH EVERY EVENING  Dispense: 90 tablet; Refill: 1  Return in about 3 months (around 03/05/2022) for follow up.   Becky Sax, MD

## 2022-02-10 ENCOUNTER — Other Ambulatory Visit: Payer: Self-pay | Admitting: *Deleted

## 2022-02-10 ENCOUNTER — Telehealth: Payer: Self-pay | Admitting: Family Medicine

## 2022-02-10 DIAGNOSIS — I10 Essential (primary) hypertension: Secondary | ICD-10-CM

## 2022-02-10 MED ORDER — SPIRONOLACTONE 50 MG PO TABS: ORAL_TABLET | ORAL | 0 refills | Status: DC

## 2022-02-10 NOTE — Telephone Encounter (Signed)
Refill has been sent.  °

## 2022-02-10 NOTE — Telephone Encounter (Signed)
PT is completely out of this medication  asking if she can have a refill of this today please:  spironolactone (ALDACTONE) 50 MG tablet [[id number]]   [facility].

## 2022-02-19 ENCOUNTER — Other Ambulatory Visit: Payer: Self-pay

## 2022-02-19 DIAGNOSIS — I10 Essential (primary) hypertension: Secondary | ICD-10-CM

## 2022-02-19 MED ORDER — METOPROLOL SUCCINATE ER 50 MG PO TB24: 50.0000 mg | ORAL_TABLET | Freq: Every day | ORAL | 0 refills | Status: DC

## 2022-03-10 ENCOUNTER — Ambulatory Visit (INDEPENDENT_AMBULATORY_CARE_PROVIDER_SITE_OTHER): Payer: Medicare Other | Admitting: Family Medicine

## 2022-03-10 ENCOUNTER — Encounter: Payer: Self-pay | Admitting: Family Medicine

## 2022-03-10 VITALS — BP 153/65 | HR 71 | Temp 97.7°F | Resp 16 | Wt 179.0 lb

## 2022-03-10 DIAGNOSIS — E1159 Type 2 diabetes mellitus with other circulatory complications: Secondary | ICD-10-CM

## 2022-03-10 DIAGNOSIS — E785 Hyperlipidemia, unspecified: Secondary | ICD-10-CM

## 2022-03-10 DIAGNOSIS — I10 Essential (primary) hypertension: Secondary | ICD-10-CM

## 2022-03-10 NOTE — Progress Notes (Unsigned)
Patient is here for follow-up visit. Patient has no new concerns today except getting older.

## 2022-03-11 ENCOUNTER — Encounter: Payer: Self-pay | Admitting: Family Medicine

## 2022-03-11 LAB — BASIC METABOLIC PANEL
BUN/Creatinine Ratio: 24 (ref 12–28)
BUN: 21 mg/dL (ref 8–27)
CO2: 23 mmol/L (ref 20–29)
Calcium: 9.8 mg/dL (ref 8.7–10.3)
Chloride: 103 mmol/L (ref 96–106)
Creatinine, Ser: 0.89 mg/dL (ref 0.57–1.00)
Glucose: 115 mg/dL — ABNORMAL HIGH (ref 70–99)
Potassium: 4.6 mmol/L (ref 3.5–5.2)
Sodium: 143 mmol/L (ref 134–144)
eGFR: 65 mL/min/{1.73_m2} (ref >59)

## 2022-03-11 LAB — ALT: ALT: 23 IU/L (ref 0–32)

## 2022-03-11 LAB — AST: AST: 23 IU/L (ref 0–40)

## 2022-03-11 LAB — LIPID PANEL
Chol/HDL Ratio: 3 ratio (ref 0.0–4.4)
Cholesterol, Total: 152 mg/dL (ref 100–199)
HDL: 51 mg/dL (ref >39)
LDL Chol Calc (NIH): 80 mg/dL (ref 0–99)
Triglycerides: 119 mg/dL (ref 0–149)
VLDL Cholesterol Cal: 21 mg/dL (ref 5–40)

## 2022-03-11 LAB — HEMOGLOBIN A1C
Est. average glucose Bld gHb Est-mCnc: 163 mg/dL
Hgb A1c MFr Bld: 7.3 % — ABNORMAL HIGH (ref 4.8–5.6)

## 2022-03-11 NOTE — Progress Notes (Signed)
Established Patient Office Visit  Subjective    Patient ID: Melanie Weaver, female    DOB: [birth year]  Age: 81 y.o. MRN: [id number]  CC: No chief complaint on file.   HPI [place] presents for routine follow up of chronic med issues including diabetes and hypertension. Patient denies acute complaints or concerns.    Outpatient Encounter Medications as of 03/10/2022  Medication Sig   amLODipine (NORVASC) 10 MG tablet TAKE 1 TABLET(10 MG) BY MOUTH DAILY   aspirin EC 81 MG tablet Take 81 mg by mouth daily.   irbesartan (AVAPRO) 300 MG tablet Take 1 tablet (300 mg total) by mouth daily.   metoprolol succinate (TOPROL-XL) 50 MG 24 hr tablet Take 1 tablet (50 mg total) by mouth daily.   Multiple Vitamin (MULTIVITAMIN WITH MINERALS) TABS tablet Take 1 tablet by mouth daily.   pravastatin (PRAVACHOL) 80 MG tablet TAKE 1 TABLET(80 MG) BY MOUTH EVERY EVENING   spironolactone (ALDACTONE) 50 MG tablet TAKE 1 TABLET(50 MG) BY MOUTH DAILY   No facility-administered encounter medications on file as of 03/10/2022.    Past Medical History:  Diagnosis Date   Essential hypertension    Mixed hyperlipidemia    Parotid mass    Pulmonary nodule    Type 2 diabetes mellitus without complication, without long-term current use of insulin (HCC)     Past Surgical History:  Procedure Laterality Date   ABDOMINAL HYSTERECTOMY      Family History  Problem Relation Age of Onset   CVA Brother    Diabetes Son    Hypertension Son    Mental retardation Son    Stroke Son    Diabetes Daughter     Social History   Socioeconomic History   Marital status: Married    Spouse name: Not on file   Number of children: Not on file   Years of education: Not on file   Highest education level: Not on file  Occupational History   Not on file  Tobacco Use   Smoking status: Former    Packs/day: 0.50    Years: 40.00    Total pack years: 20.00    Types: Cigarettes    Quit date: 12/04/2020    Years since  quitting: 1.2   Smokeless tobacco: Never   Tobacco comments:     1-2 cigs per day  Vaping Use   Vaping Use: Never used  Substance and Sexual Activity   Alcohol use: No   Drug use: No   Sexual activity: Not Currently  Other Topics Concern   Not on file  Social History Narrative   Lives with son with who is mentally handicapped.   Social Determinants of Health   Financial Resource Strain: Low Risk  (03/30/2021)   Overall Financial Resource Strain (CARDIA)    Difficulty of Paying Living Expenses: Not very hard  Food Insecurity: No Food Insecurity (03/30/2021)   Hunger Vital Sign    Worried About Running Out of Food in the Last Year: Never true    Ran Out of Food in the Last Year: Never true  Transportation Needs: No Transportation Needs (03/30/2021)   PRAPARE - Hydrologist (Medical): No    Lack of Transportation (Non-Medical): No  Physical Activity: Insufficiently Active (03/30/2021)   Exercise Vital Sign    Days of Exercise per Week: 3 days    Minutes of Exercise per Session: 10 min  Stress: No Stress Concern Present (03/30/2021)   [employer] - Occupational Stress Questionnaire    Feeling of Stress : Not at all  Social Connections: Not on file  Intimate Partner Violence: Not on file    Review of Systems  All other systems reviewed and are negative.       Objective    BP (!) 153/65   Pulse 71   Temp 97.7 F (36.5 C) (Oral)   Resp 16   Wt 179 lb (81.2 kg)   SpO2 94%   BMI 32.74 kg/m   Physical Exam Vitals and nursing note reviewed.  Constitutional:      General: She is not in acute distress. Cardiovascular:     Rate and Rhythm: Normal rate and regular rhythm.  Pulmonary:     Effort: Pulmonary effort is normal.     Breath sounds: Normal breath sounds.  Abdominal:     Palpations: Abdomen is soft.     Tenderness: There is no abdominal tenderness.  Musculoskeletal:     Right lower leg: No edema.      Left lower leg: No edema.     Comments: Patient utilizing a cane for stability  Neurological:     General: No focal deficit present.     Mental Status: She is alert and oriented to person, place, and time.         Assessment & Plan:   1. Type 2 diabetes mellitus with other circulatory complication, without long-term current use of insulin ([place]) Continue present management and monitor. Monitoring labs ordered - Basic Metabolic Panel - Hemoglobin A1c  2. Essential hypertension Elevated reading. Will continue present management and monitor.  - Basic Metabolic Panel  3. Hyperlipidemia, unspecified hyperlipidemia type Continue. Monitoring labs ordered.  - AST - ALT - Lipid Panel    No follow-ups on file.   Becky Sax, MD

## 2022-04-21 ENCOUNTER — Ambulatory Visit: Payer: Medicare Other | Admitting: Family Medicine

## 2022-05-06 ENCOUNTER — Telehealth: Payer: Self-pay | Admitting: Family Medicine

## 2022-05-06 NOTE — Telephone Encounter (Signed)
Pt asking for med refill (bottle empty)  spironolactone (ALDACTONE) 50 MG tablet [[id number]]   Pharmacy  [facility] [id number]  [place], [place] [address]  Phone:  [phone]  Fax:  [phone]

## 2022-05-07 ENCOUNTER — Other Ambulatory Visit: Payer: Self-pay | Admitting: *Deleted

## 2022-05-07 DIAGNOSIS — I10 Essential (primary) hypertension: Secondary | ICD-10-CM

## 2022-05-07 MED ORDER — SPIRONOLACTONE 50 MG PO TABS: ORAL_TABLET | ORAL | 0 refills | Status: DC

## 2022-05-07 NOTE — Telephone Encounter (Signed)
Refill sent to pharmacy.   

## 2022-05-14 ENCOUNTER — Other Ambulatory Visit: Payer: Self-pay | Admitting: Family Medicine

## 2022-05-14 DIAGNOSIS — I10 Essential (primary) hypertension: Secondary | ICD-10-CM

## 2022-05-14 NOTE — Telephone Encounter (Signed)
Requested Prescriptions  Pending Prescriptions Disp Refills   amLODipine (NORVASC) 10 MG tablet [Pharmacy Med Name: AMLODIPINE BESYLATE '10MG'$  TABLETS] 90 tablet 1    Sig: TAKE 1 TABLET(10 MG) BY MOUTH DAILY     Cardiovascular: Calcium Channel Blockers 2 Failed - 05/14/2022  9:04 AM      Failed - Last BP in normal range    BP Readings from Last 1 Encounters:  03/10/22 (!) 153/65         Passed - Last Heart Rate in normal range    Pulse Readings from Last 1 Encounters:  03/10/22 71         Passed - Valid encounter within last 6 months    Recent Outpatient Visits           2 months ago Type 2 diabetes mellitus with other circulatory complication, without long-term current use of insulin ([place])   Primary Care at [facility], MD   5 months ago Essential hypertension   Primary Care at [facility], MD   8 months ago Primary hypertension   Primary Care at [facility], MD   1 year ago Essential hypertension   Primary Care at [facility], MD   1 year ago Essential hypertension   Primary Care at [facility], Flonnie Hailstone, NP       Future Appointments             In 3 weeks Dorna Mai, MD Primary Care at [facility]

## 2022-05-15 ENCOUNTER — Other Ambulatory Visit: Payer: Self-pay | Admitting: *Deleted

## 2022-05-15 DIAGNOSIS — E785 Hyperlipidemia, unspecified: Secondary | ICD-10-CM

## 2022-05-15 MED ORDER — PRAVASTATIN SODIUM 80 MG PO TABS: ORAL_TABLET | ORAL | 1 refills | Status: DC

## 2022-06-08 ENCOUNTER — Encounter: Payer: Self-pay | Admitting: Family Medicine

## 2022-06-08 ENCOUNTER — Ambulatory Visit (INDEPENDENT_AMBULATORY_CARE_PROVIDER_SITE_OTHER): Payer: Medicare Other | Admitting: Family Medicine

## 2022-06-08 VITALS — BP 165/74 | HR 74 | Temp 98.1°F | Resp 16 | Wt 174.4 lb

## 2022-06-08 DIAGNOSIS — Z87891 Personal history of nicotine dependence: Secondary | ICD-10-CM

## 2022-06-08 DIAGNOSIS — E1159 Type 2 diabetes mellitus with other circulatory complications: Secondary | ICD-10-CM | POA: Diagnosis not present

## 2022-06-08 DIAGNOSIS — I10 Essential (primary) hypertension: Secondary | ICD-10-CM | POA: Diagnosis not present

## 2022-06-08 DIAGNOSIS — Z0000 Encounter for general adult medical examination without abnormal findings: Secondary | ICD-10-CM

## 2022-06-08 DIAGNOSIS — E785 Hyperlipidemia, unspecified: Secondary | ICD-10-CM

## 2022-06-08 DIAGNOSIS — Z0001 Encounter for general adult medical examination with abnormal findings: Secondary | ICD-10-CM | POA: Diagnosis not present

## 2022-06-08 LAB — POCT GLYCOSYLATED HEMOGLOBIN (HGB A1C): Hemoglobin A1C: 7.3 % — AB (ref 4.0–5.6)

## 2022-06-08 NOTE — Progress Notes (Unsigned)
Patient is here for their 3 month follow-up Patient has no concerns today Care gaps have been discussed with patient   Subjective:   Melanie Weaver is a 81 y.o. female who presents for Medicare Annual (Subsequent) preventive examination.  Review of Systems    Refer to Pcp       Objective:    Today's Vitals   06/08/22 1420 06/08/22 1426  BP: (!) 156/77 (!) 165/74  Pulse: 76 74  Resp: 16   Temp: 98.1 F (36.7 C)   TempSrc: Oral   SpO2: 94%   Weight: 174 lb 6.4 oz (79.1 kg)    Body mass index is 31.9 kg/m.     05/09/2018    3:51 PM 05/06/2018    2:12 AM 05/06/2018    2:00 AM 05/05/2018    4:27 PM  Advanced Directives  Does Patient Have a Medical Advance Directive? Yes  No Yes  Type of Paramedic of [place];Living will  [place];Living will [place];Living will  Does patient want to make changes to medical advance directive? No - Patient declined     Copy of [place] in Chart? No - copy requested  No - copy requested   Would patient like information on creating a medical advance directive? No - Patient declined No - Patient declined      Current Medications (verified) Outpatient Encounter Medications as of 06/08/2022  Medication Sig   amLODipine (NORVASC) 10 MG tablet TAKE 1 TABLET(10 MG) BY MOUTH DAILY   aspirin EC 81 MG tablet Take 81 mg by mouth daily.   irbesartan (AVAPRO) 300 MG tablet Take 1 tablet (300 mg total) by mouth daily.   metoprolol succinate (TOPROL-XL) 50 MG 24 hr tablet TAKE 1 TABLET(50 MG) BY MOUTH DAILY   Multiple Vitamin (MULTIVITAMIN WITH MINERALS) TABS tablet Take 1 tablet by mouth daily.   pravastatin (PRAVACHOL) 80 MG tablet TAKE 1 TABLET(80 MG) BY MOUTH EVERY EVENING   spironolactone (ALDACTONE) 50 MG tablet TAKE 1 TABLET(50 MG) BY MOUTH DAILY   No facility-administered encounter medications on file as of 06/08/2022.    Allergies (verified) Amoxicillin and  Maxzide [hydrochlorothiazide w-triamterene]   History: Past Medical History:  Diagnosis Date   Essential hypertension    Mixed hyperlipidemia    Parotid mass    Pulmonary nodule    Type 2 diabetes mellitus without complication, without long-term current use of insulin (HCC)    Past Surgical History:  Procedure Laterality Date   ABDOMINAL HYSTERECTOMY     Family History  Problem Relation Age of Onset   CVA Brother    Diabetes Son    Hypertension Son    Mental retardation Son    Stroke Son    Diabetes Daughter    Social History   Socioeconomic History   Marital status: Married    Spouse name: Not on file   Number of children: Not on file   Years of education: Not on file   Highest education level: Not on file  Occupational History   Not on file  Tobacco Use   Smoking status: Former    Packs/day: 0.50    Years: 40.00    Total pack years: 20.00    Types: Cigarettes    Quit date: 12/04/2020    Years since quitting: 1.5   Smokeless tobacco: Never   Tobacco comments:     1-2 cigs per day  Vaping Use   Vaping Use: Never used  Substance and Sexual Activity   Alcohol use: No   Drug use: No   Sexual activity: Not Currently  Other Topics Concern   Not on file  Social History Narrative   Lives with son with who is mentally handicapped.   Social Determinants of Health   Financial Resource Strain: Low Risk  (03/30/2021)   Overall Financial Resource Strain (CARDIA)    Difficulty of Paying Living Expenses: Not very hard  Food Insecurity: No Food Insecurity (03/30/2021)   Hunger Vital Sign    Worried About Running Out of Food in the Last Year: Never true    Ran Out of Food in the Last Year: Never true  Transportation Needs: No Transportation Needs (03/30/2021)   PRAPARE - Hydrologist (Medical): No    Lack of Transportation (Non-Medical): No  Physical Activity: Insufficiently Active (03/30/2021)   Exercise Vital Sign    Days of Exercise per  Week: 3 days    Minutes of Exercise per Session: 10 min  Stress: No Stress Concern Present (03/30/2021)   [place]    Feeling of Stress : Not at all  Social Connections: Not on file    Tobacco Counseling Counseling given: Not Answered Tobacco comments:  1-2 cigs per day   Clinical Intake:  Pre-visit preparation completed: No  Pain : No/denies pain     Diabetes: Yes CBG done?: No Did pt. bring in CBG monitor from home?: No     Diabetic?n/a  Interpreter Needed?: No      Activities of Daily Living     No data to display           Patient Care Team: Dorna Mai, MD as PCP - General (Family Medicine)  Indicate any recent Medical Services you may have received from other than [facility] providers in the past year (date may be approximate).     Assessment:   This is a routine wellness examination for Melanie Weaver.  Hearing/Vision screen No results found.  Dietary issues and exercise activities discussed:     Goals Addressed   None   Depression Screen    06/08/2022    2:22 PM 03/10/2022    1:15 PM 12/03/2021    2:10 PM 09/01/2021    2:37 PM 04/01/2021    9:11 AM 03/30/2021   11:14 AM 12/04/2020    1:52 PM  PHQ 2/9 Scores  PHQ - 2 Score 0 0 0 0 0 0 0  PHQ- 9 Score 0 0 0 0 0  0    Fall Risk    03/10/2022    1:19 PM 12/04/2020    1:52 PM  [place] in the past year? 0 1  Number falls in past yr: 0 0  Injury with Fall? 0 1  Risk for fall due to :  Impaired balance/gait  Follow up  Falls evaluation completed    FALL RISK PREVENTION PERTAINING TO THE HOME:  Any stairs in or around the home? Yes  If so, are there any without handrails? No  Home free of loose throw rugs in walkways, pet beds, electrical cords, etc? No  Adequate lighting in your home to reduce risk of falls? Yes   ASSISTIVE DEVICES UTILIZED TO PREVENT FALLS:  Life alert? No  Use of a cane, walker or w/c? No  Grab  bars in the bathroom? No  Shower chair or bench in shower? No  Elevated toilet seat  or a handicapped toilet? No   TIMED UP AND GO:  Was the test performed? Yes .  Length of time to ambulate 10 feet: 60 sec.   Gait steady and fast with assistive device  Cognitive Function:    12/01/2018   11:57 AM  MMSE - Mini Mental State Exam  Orientation to time 5  Orientation to Place 5  Registration 3  Attention/ Calculation 4  Recall 2  Language- name 2 objects 2  Language- repeat 1  Language- follow 3 step command 3  Language- read & follow direction 1  Write a sentence 1  Copy design 0  Total score 27        03/30/2021   11:10 AM  6CIT Screen  What Year? 0 points  What month? 0 points  What time? 0 points  Count back from 20 0 points  Months in reverse 0 points  Repeat phrase 2 points  Total Score 2 points    Immunizations Immunization History  Administered Date(s) Administered   PFIZER(Purple Top)SARS-COV-2 Vaccination 11/27/2019, 12/18/2019   Pneumococcal Conjugate-13 04/06/2017, 10/18/2017   Pneumococcal Polysaccharide-23 05/10/2018    TDAP status: Due, Education has been provided regarding the importance of this vaccine. Advised may receive this vaccine at local pharmacy or Health Dept. Aware to provide a copy of the vaccination record if obtained from local pharmacy or Health Dept. Verbalized acceptance and understanding.  Flu Vaccine status: Declined, Education has been provided regarding the importance of this vaccine but patient still declined. Advised may receive this vaccine at local pharmacy or Health Dept. Aware to provide a copy of the vaccination record if obtained from local pharmacy or Health Dept. Verbalized acceptance and understanding.  Pneumococcal vaccine status: Up to date  Covid-19 vaccine status: Completed vaccines  Qualifies for Shingles Vaccine? No   Zostavax completed Yes   Shingrix Completed?: Yes  Screening Tests Health Maintenance   Topic Date Due   DTaP/Tdap/Td (1 - Tdap) Never done   OPHTHALMOLOGY EXAM  01/21/2021   Diabetic kidney evaluation - Urine ACR  04/19/2021   Medicare Annual Wellness (AWV)  03/30/2022   FOOT EXAM  07/05/2022 (Originally 04/16/2020)   HEMOGLOBIN A1C  12/08/2022   Diabetic kidney evaluation - GFR measurement  03/11/2023   Pneumonia Vaccine 39+ Years old  Completed   DEXA SCAN  Completed   HPV VACCINES  Aged Out   INFLUENZA VACCINE  Discontinued   COVID-19 Vaccine  Discontinued   Zoster Vaccines- Shingrix  Discontinued    Health Maintenance  Health Maintenance Due  Topic Date Due   DTaP/Tdap/Td (1 - Tdap) Never done   OPHTHALMOLOGY EXAM  01/21/2021   Diabetic kidney evaluation - Urine ACR  04/19/2021   Medicare Annual Wellness (AWV)  03/30/2022    Colorectal cancer screening: Type of screening: Colonoscopy. Completed  . Repeat every   years  Mammogram status: No longer required due to  .  Bone Density status: Completed 10/07/2016. Results reflect: Bone density results: NORMAL. Repeat every   years.  Lung Cancer Screening: (Low Dose CT Chest recommended if Age 44-80 years, 30 pack-year currently smoking OR have quit w/in 15years.) does qualify.   Lung Cancer Screening Referral: n/a  Additional Screening:  Hepatitis C Screening: does qualify; Completed n/a   Vision Screening: Recommended annual ophthalmology exams for early detection of glaucoma and other disorders of the eye. Is the patient up to date with their annual eye exam?  No  Who is the provider or what is  the name of the office in which the patient attends annual eye exams?  If pt is not established with a provider, would they like to be referred to a provider to establish care? No .   Dental Screening: Recommended annual dental exams for proper oral hygiene  Community Resource Referral / Chronic Care Management: CRR required this visit?  No   CCM required this visit?  No      Plan:     I have  personally reviewed and noted the following in the patient's chart:   Medical and social history Use of alcohol, tobacco or illicit drugs  Current medications and supplements including opioid prescriptions. Patient is not currently taking opioid prescriptions. Functional ability and status Nutritional status Physical activity Advanced directives List of other physicians Hospitalizations, surgeries, and ER visits in previous 12 months Vitals Screenings to include cognitive, depression, and falls Referrals and appointments  In addition, I have reviewed and discussed with patient certain preventive protocols, quality metrics, and best practice recommendations. A written personalized care plan for preventive services as well as general preventive health recommendations were provided to patient.     Melene Plan, RMA   06/08/2022   Nurse Notes:

## 2022-06-09 ENCOUNTER — Encounter: Payer: Self-pay | Admitting: Family Medicine

## 2022-06-09 NOTE — Progress Notes (Signed)
Established Patient Office Visit  Subjective    Patient ID: Melanie Weaver, female    DOB: 24-Jul-1940  Age: 81 y.o. MRN: [id number]  CC:  Chief Complaint  Patient presents with   Follow-up    HPI Zaylin Pistilli presents for routine follow up of chronic med issues. Patient denies acute complaints or concerns.    Outpatient Encounter Medications as of 06/08/2022  Medication Sig   amLODipine (NORVASC) 10 MG tablet TAKE 1 TABLET(10 MG) BY MOUTH DAILY   aspirin EC 81 MG tablet Take 81 mg by mouth daily.   irbesartan (AVAPRO) 300 MG tablet Take 1 tablet (300 mg total) by mouth daily.   metoprolol succinate (TOPROL-XL) 50 MG 24 hr tablet TAKE 1 TABLET(50 MG) BY MOUTH DAILY   Multiple Vitamin (MULTIVITAMIN WITH MINERALS) TABS tablet Take 1 tablet by mouth daily.   pravastatin (PRAVACHOL) 80 MG tablet TAKE 1 TABLET(80 MG) BY MOUTH EVERY EVENING   spironolactone (ALDACTONE) 50 MG tablet TAKE 1 TABLET(50 MG) BY MOUTH DAILY   No facility-administered encounter medications on file as of 06/08/2022.    Past Medical History:  Diagnosis Date   Essential hypertension    Mixed hyperlipidemia    Parotid mass    Pulmonary nodule    Type 2 diabetes mellitus without complication, without long-term current use of insulin (HCC)     Past Surgical History:  Procedure Laterality Date   ABDOMINAL HYSTERECTOMY      Family History  Problem Relation Age of Onset   CVA Brother    Diabetes Son    Hypertension Son    Mental retardation Son    Stroke Son    Diabetes Daughter     Social History   Socioeconomic History   Marital status: Married    Spouse name: Not on file   Number of children: Not on file   Years of education: Not on file   Highest education level: Not on file  Occupational History   Not on file  Tobacco Use   Smoking status: Former    Packs/day: 0.50    Years: 40.00    Total pack years: 20.00    Types: Cigarettes    Quit date: 12/04/2020    Years since quitting: 1.5    Smokeless tobacco: Never   Tobacco comments:     1-2 cigs per day  Vaping Use   Vaping Use: Never used  Substance and Sexual Activity   Alcohol use: No   Drug use: No   Sexual activity: Not Currently  Other Topics Concern   Not on file  Social History Narrative   Lives with son with who is mentally handicapped.   Social Determinants of Health   Financial Resource Strain: Low Risk  (03/30/2021)   Overall Financial Resource Strain (CARDIA)    Difficulty of Paying Living Expenses: Not very hard  Food Insecurity: No Food Insecurity (03/30/2021)   Hunger Vital Sign    Worried About Running Out of Food in the Last Year: Never true    Ran Out of Food in the Last Year: Never true  Transportation Needs: No Transportation Needs (03/30/2021)   PRAPARE - Hydrologist (Medical): No    Lack of Transportation (Non-Medical): No  Physical Activity: Insufficiently Active (03/30/2021)   Exercise Vital Sign    Days of Exercise per Week: 3 days    Minutes of Exercise per Session: 10 min  Stress: No Stress Concern Present (03/30/2021)   [employer] - Occupational Stress Questionnaire    Feeling of Stress : Not at all  Social Connections: Not on file  Intimate Partner Violence: Not on file    Review of Systems  All other systems reviewed and are negative.       Objective    BP (!) 165/74   Pulse 74   Temp 98.1 F (36.7 C) (Oral)   Resp 16   Wt 174 lb 6.4 oz (79.1 kg)   SpO2 94%   BMI 31.90 kg/m   Physical Exam Vitals and nursing note reviewed.  Constitutional:      General: She is not in acute distress. Cardiovascular:     Rate and Rhythm: Normal rate and regular rhythm.  Pulmonary:     Effort: Pulmonary effort is normal.     Breath sounds: Normal breath sounds.  Abdominal:     Palpations: Abdomen is soft.     Tenderness: There is no abdominal tenderness.  Musculoskeletal:     Right lower leg: No edema.     Left  lower leg: No edema.     Comments: Patient utilizing a cane for stability  Neurological:     General: No focal deficit present.     Mental Status: She is alert and oriented to person, place, and time.         Assessment & Plan:   1. Type 2 diabetes mellitus with other circulatory complication, without long-term current use of insulin (HCC) A1c is stable and just above gaol. Continue and monitor - POCT glycosylated hemoglobin (Hb A1C)  2. Essential hypertension Elevated reading. Continue present management and monitor  3. Encounter for Medicare annual wellness exam   4. Hyperlipidemia, unspecified hyperlipidemia type Continue   5. Stopped smoking Encouraged continued cessation    Return in about 3 months (around 09/07/2022).   Becky Sax, MD

## 2022-07-31 ENCOUNTER — Other Ambulatory Visit: Payer: Self-pay | Admitting: *Deleted

## 2022-07-31 DIAGNOSIS — I10 Essential (primary) hypertension: Secondary | ICD-10-CM

## 2022-07-31 MED ORDER — SPIRONOLACTONE 50 MG PO TABS: ORAL_TABLET | ORAL | 0 refills | Status: DC

## 2022-08-08 ENCOUNTER — Other Ambulatory Visit: Payer: Self-pay | Admitting: Family Medicine

## 2022-08-08 DIAGNOSIS — I10 Essential (primary) hypertension: Secondary | ICD-10-CM

## 2022-08-10 NOTE — Telephone Encounter (Signed)
Requested Prescriptions  Pending Prescriptions Disp Refills   metoprolol succinate (TOPROL-XL) 50 MG 24 hr tablet [Pharmacy Med Name: METOPROLOL ER SUCCINATE '50MG'$  TABS] 90 tablet 0    Sig: TAKE 1 TABLET(50 MG) BY MOUTH DAILY     Cardiovascular:  Beta Blockers Failed - 08/08/2022  7:04 AM      Failed - Last BP in normal range    BP Readings from Last 1 Encounters:  06/08/22 (!) 165/74         Passed - Last Heart Rate in normal range    Pulse Readings from Last 1 Encounters:  06/08/22 74         Passed - Valid encounter within last 6 months    Recent Outpatient Visits           2 months ago Type 2 diabetes mellitus with other circulatory complication, without long-term current use of insulin ([place])   [facility] [facility], Clyde Canterbury, MD   5 months ago Type 2 diabetes mellitus with other circulatory complication, without long-term current use of insulin ([place])   [place] [facility], MD   8 months ago Essential hypertension   [place] [facility], MD   11 months ago Primary hypertension   [place] [facility], MD   1 year ago Essential hypertension   [place] [facility], MD       Future Appointments             In 4 weeks Dorna Mai, MD [place] at [facility]

## 2022-09-07 ENCOUNTER — Ambulatory Visit (INDEPENDENT_AMBULATORY_CARE_PROVIDER_SITE_OTHER): Payer: Medicare Other | Admitting: Family Medicine

## 2022-09-07 ENCOUNTER — Encounter: Payer: Self-pay | Admitting: Family Medicine

## 2022-09-07 VITALS — BP 143/70 | HR 74 | Temp 98.1°F | Resp 16 | Wt 172.0 lb

## 2022-09-07 DIAGNOSIS — I10 Essential (primary) hypertension: Secondary | ICD-10-CM

## 2022-09-07 DIAGNOSIS — Z87891 Personal history of nicotine dependence: Secondary | ICD-10-CM | POA: Diagnosis not present

## 2022-09-07 DIAGNOSIS — E1159 Type 2 diabetes mellitus with other circulatory complications: Secondary | ICD-10-CM

## 2022-09-07 DIAGNOSIS — E785 Hyperlipidemia, unspecified: Secondary | ICD-10-CM

## 2022-09-07 LAB — POCT GLYCOSYLATED HEMOGLOBIN (HGB A1C)
HbA1c, POC (controlled diabetic range): 7.4 % — AB (ref 0.0–7.0)
Hemoglobin A1C: 7.4 % — AB (ref 4.0–5.6)

## 2022-09-09 ENCOUNTER — Encounter: Payer: Self-pay | Admitting: Family Medicine

## 2022-09-09 LAB — MICROALBUMIN / CREATININE URINE RATIO
Creatinine, Urine: 84.7 mg/dL
Microalb/Creat Ratio: 15 mg/g creat (ref 0–29)
Microalbumin, Urine: 12.6 ug/mL

## 2022-09-09 NOTE — Progress Notes (Signed)
Established Patient Office Visit  Subjective    Patient ID: Melanie Weaver, female    DOB: 1941/04/21  Age: 82 y.o. MRN: [id number]  CC:  Chief Complaint  Patient presents with   Follow-up    HPI Chrisa Weaver presents for routine follow up of chronic med issues. Patient denies acute complaints or concerns.    Outpatient Encounter Medications as of 09/07/2022  Medication Sig   amLODipine (NORVASC) 10 MG tablet TAKE 1 TABLET(10 MG) BY MOUTH DAILY   aspirin EC 81 MG tablet Take 81 mg by mouth daily.   irbesartan (AVAPRO) 300 MG tablet Take 1 tablet (300 mg total) by mouth daily.   metoprolol succinate (TOPROL-XL) 50 MG 24 hr tablet TAKE 1 TABLET(50 MG) BY MOUTH DAILY   Multiple Vitamin (MULTIVITAMIN WITH MINERALS) TABS tablet Take 1 tablet by mouth daily.   pravastatin (PRAVACHOL) 80 MG tablet TAKE 1 TABLET(80 MG) BY MOUTH EVERY EVENING   spironolactone (ALDACTONE) 50 MG tablet TAKE 1 TABLET(50 MG) BY MOUTH DAILY   No facility-administered encounter medications on file as of 09/07/2022.    Past Medical History:  Diagnosis Date   Essential hypertension    Mixed hyperlipidemia    Parotid mass    Pulmonary nodule    Type 2 diabetes mellitus without complication, without long-term current use of insulin (HCC)     Past Surgical History:  Procedure Laterality Date   ABDOMINAL HYSTERECTOMY      Family History  Problem Relation Age of Onset   CVA Brother    Diabetes Son    Hypertension Son    Mental retardation Son    Stroke Son    Diabetes Daughter     Social History   Socioeconomic History   Marital status: Married    Spouse name: Not on file   Number of children: Not on file   Years of education: Not on file   Highest education level: Not on file  Occupational History   Not on file  Tobacco Use   Smoking status: Former    Packs/day: 0.50    Years: 40.00    Total pack years: 20.00    Types: Cigarettes    Quit date: 12/04/2020    Years since quitting: 1.7    Smokeless tobacco: Never   Tobacco comments:     1-2 cigs per day  Vaping Use   Vaping Use: Never used  Substance and Sexual Activity   Alcohol use: No   Drug use: No   Sexual activity: Not Currently  Other Topics Concern   Not on file  Social History Narrative   Lives with son with who is mentally handicapped.   Social Determinants of Health   Financial Resource Strain: Low Risk  (03/30/2021)   Overall Financial Resource Strain (CARDIA)    Difficulty of Paying Living Expenses: Not very hard  Food Insecurity: No Food Insecurity (03/30/2021)   Hunger Vital Sign    Worried About Running Out of Food in the Last Year: Never true    Ran Out of Food in the Last Year: Never true  Transportation Needs: No Transportation Needs (03/30/2021)   PRAPARE - Hydrologist (Medical): No    Lack of Transportation (Non-Medical): No  Physical Activity: Insufficiently Active (03/30/2021)   Exercise Vital Sign    Days of Exercise per Week: 3 days    Minutes of Exercise per Session: 10 min  Stress: No Stress Concern Present (03/30/2021)   [employer] - Occupational Stress Questionnaire    Feeling of Stress : Not at all  Social Connections: Not on file  Intimate Partner Violence: Not on file    Review of Systems  All other systems reviewed and are negative.       Objective    BP (!) 143/70   Pulse 74   Temp 98.1 F (36.7 C) (Oral)   Resp 16   Wt 172 lb (78 kg)   SpO2 94%   BMI 31.46 kg/m   Physical Exam Vitals and nursing note reviewed.  Constitutional:      General: She is not in acute distress. Cardiovascular:     Rate and Rhythm: Normal rate and regular rhythm.  Pulmonary:     Effort: Pulmonary effort is normal.     Breath sounds: Normal breath sounds.  Abdominal:     Palpations: Abdomen is soft.     Tenderness: There is no abdominal tenderness.  Musculoskeletal:     Right lower leg: No edema.     Left lower leg: No  edema.     Comments: Patient utilizing a cane for stability  Neurological:     General: No focal deficit present.     Mental Status: She is alert and oriented to person, place, and time.         Assessment & Plan:   1. Type 2 diabetes mellitus with other circulatory complication, without long-term current use of insulin (HCC) Stable A1c but slightly above goal.  - POCT glycosylated hemoglobin (Hb A1C) - Microalbumin / creatinine urine ratio  2. Essential hypertension Slightly elevated.   3. Hyperlipidemia, unspecified hyperlipidemia type Continue   4. Stopped smoking     Return in about 3 months (around 12/08/2022).   Melanie Sax, MD

## 2022-11-05 ENCOUNTER — Telehealth: Payer: Self-pay | Admitting: Family Medicine

## 2022-11-05 ENCOUNTER — Other Ambulatory Visit: Payer: Self-pay | Admitting: *Deleted

## 2022-11-05 DIAGNOSIS — I10 Essential (primary) hypertension: Secondary | ICD-10-CM

## 2022-11-05 MED ORDER — SPIRONOLACTONE 50 MG PO TABS: ORAL_TABLET | ORAL | 2 refills | Status: DC

## 2022-11-05 NOTE — Telephone Encounter (Signed)
Refill for Spironolocatone to be sent to [facility] pharmacy.

## 2022-11-06 ENCOUNTER — Other Ambulatory Visit: Payer: Self-pay | Admitting: Family Medicine

## 2022-11-06 DIAGNOSIS — E785 Hyperlipidemia, unspecified: Secondary | ICD-10-CM

## 2022-11-06 DIAGNOSIS — I10 Essential (primary) hypertension: Secondary | ICD-10-CM

## 2022-12-21 ENCOUNTER — Ambulatory Visit (INDEPENDENT_AMBULATORY_CARE_PROVIDER_SITE_OTHER): Payer: Medicare Other | Admitting: Family Medicine

## 2022-12-21 ENCOUNTER — Encounter: Payer: Self-pay | Admitting: Family Medicine

## 2022-12-21 VITALS — BP 172/74 | HR 67 | Temp 98.1°F | Resp 16 | Wt 169.2 lb

## 2022-12-21 DIAGNOSIS — E1159 Type 2 diabetes mellitus with other circulatory complications: Secondary | ICD-10-CM

## 2022-12-21 DIAGNOSIS — Z720 Tobacco use: Secondary | ICD-10-CM

## 2022-12-21 DIAGNOSIS — I10 Essential (primary) hypertension: Secondary | ICD-10-CM | POA: Diagnosis not present

## 2022-12-21 DIAGNOSIS — E785 Hyperlipidemia, unspecified: Secondary | ICD-10-CM | POA: Diagnosis not present

## 2022-12-21 DIAGNOSIS — F17210 Nicotine dependence, cigarettes, uncomplicated: Secondary | ICD-10-CM | POA: Diagnosis not present

## 2022-12-21 LAB — POCT GLYCOSYLATED HEMOGLOBIN (HGB A1C): Hemoglobin A1C: 7 % — AB (ref 4.0–5.6)

## 2022-12-21 NOTE — Progress Notes (Unsigned)
Patient is here for their 3 month follow-up Patient has no concerns today Care gaps have been discussed with patient  

## 2022-12-21 NOTE — Progress Notes (Unsigned)
New Patient Office Visit  Subjective    Patient ID: Melanie Weaver, female    DOB: 05-28-1941  Age: 82 y.o. MRN: [id number]  CC:  Chief Complaint  Patient presents with   Follow-up   Diabetes    HPI Melanie Weaver presents to establish care ***  Outpatient Encounter Medications as of 12/21/2022  Medication Sig   amLODipine (NORVASC) 10 MG tablet TAKE 1 TABLET(10 MG) BY MOUTH DAILY   aspirin EC 81 MG tablet Take 81 mg by mouth daily.   irbesartan (AVAPRO) 300 MG tablet Take 1 tablet (300 mg total) by mouth daily.   metoprolol succinate (TOPROL-XL) 50 MG 24 hr tablet TAKE 1 TABLET(50 MG) BY MOUTH DAILY   Multiple Vitamin (MULTIVITAMIN WITH MINERALS) TABS tablet Take 1 tablet by mouth daily.   pravastatin (PRAVACHOL) 80 MG tablet TAKE 1 TABLET(80 MG) BY MOUTH EVERY EVENING   spironolactone (ALDACTONE) 50 MG tablet TAKE 1 TABLET(50 MG) BY MOUTH DAILY   No facility-administered encounter medications on file as of 12/21/2022.    Past Medical History:  Diagnosis Date   Essential hypertension    Mixed hyperlipidemia    Parotid mass    Pulmonary nodule    Type 2 diabetes mellitus without complication, without long-term current use of insulin (HCC)     Past Surgical History:  Procedure Laterality Date   ABDOMINAL HYSTERECTOMY      Family History  Problem Relation Age of Onset   CVA Brother    Diabetes Son    Hypertension Son    Mental retardation Son    Stroke Son    Diabetes Daughter     Social History   Socioeconomic History   Marital status: Married    Spouse name: Not on file   Number of children: Not on file   Years of education: Not on file   Highest education level: Not on file  Occupational History   Not on file  Tobacco Use   Smoking status: Former    Packs/day: 0.50    Years: 40.00    Additional pack years: 0.00    Total pack years: 20.00    Types: Cigarettes    Quit date: 12/04/2020    Years since quitting: 2.0   Smokeless tobacco: Never    Tobacco comments:     1-2 cigs per day  Vaping Use   Vaping Use: Never used  Substance and Sexual Activity   Alcohol use: No   Drug use: No   Sexual activity: Not Currently  Other Topics Concern   Not on file  Social History Narrative   Lives with son with who is mentally handicapped.   Social Determinants of Health   Financial Resource Strain: Low Risk  (03/30/2021)   Overall Financial Resource Strain (CARDIA)    Difficulty of Paying Living Expenses: Not very hard  Food Insecurity: No Food Insecurity (03/30/2021)   Hunger Vital Sign    Worried About Running Out of Food in the Last Year: Never true    Ran Out of Food in the Last Year: Never true  Transportation Needs: No Transportation Needs (03/30/2021)   PRAPARE - Administrator, Civil Service (Medical): No    Lack of Transportation (Non-Medical): No  Physical Activity: Insufficiently Active (03/30/2021)   Exercise Vital Sign    Days of Exercise per Week: 3 days    Minutes of Exercise per Session: 10 min  Stress: No Stress Concern Present (03/30/2021)   [employer] of Occupational  Health - Occupational Stress Questionnaire    Feeling of Stress : Not at all  Social Connections: Not on file  Intimate Partner Violence: Not on file    ROS      Objective    BP (!) 172/74   Pulse 67   Temp 98.1 F (36.7 C) (Oral)   Resp 16   Wt 169 lb 3.2 oz (76.7 kg)   SpO2 98%   BMI 30.95 kg/m   Physical Exam  {Labs (Optional):23779}    Assessment & Plan:   Problem List Items Addressed This Visit       Endocrine   DM2 (diabetes mellitus, type 2) (HCC) - Primary (Chronic)   Relevant Orders   HM DIABETES FOOT EXAM (Completed)   POCT glycosylated hemoglobin (Hb A1C) (Completed)     Other   Hyperlipidemia   Tobacco abuse   Other Visit Diagnoses     Essential hypertension           No follow-ups on file.   Tommie Raymond, MD

## 2022-12-23 ENCOUNTER — Encounter: Payer: Self-pay | Admitting: Family Medicine

## 2022-12-25 DIAGNOSIS — H40053 Ocular hypertension, bilateral: Secondary | ICD-10-CM | POA: Diagnosis not present

## 2022-12-25 DIAGNOSIS — H25813 Combined forms of age-related cataract, bilateral: Secondary | ICD-10-CM | POA: Diagnosis not present

## 2022-12-25 DIAGNOSIS — E119 Type 2 diabetes mellitus without complications: Secondary | ICD-10-CM | POA: Diagnosis not present

## 2023-01-21 ENCOUNTER — Other Ambulatory Visit: Payer: Self-pay | Admitting: Family Medicine

## 2023-01-21 DIAGNOSIS — I10 Essential (primary) hypertension: Secondary | ICD-10-CM

## 2023-01-21 MED ORDER — SPIRONOLACTONE 50 MG PO TABS
ORAL_TABLET | ORAL | 2 refills | Status: DC
Start: 2023-01-21 — End: 2023-04-21

## 2023-02-02 ENCOUNTER — Other Ambulatory Visit: Payer: Self-pay | Admitting: Family Medicine

## 2023-02-02 DIAGNOSIS — I10 Essential (primary) hypertension: Secondary | ICD-10-CM

## 2023-03-24 ENCOUNTER — Ambulatory Visit: Payer: Medicare Other | Admitting: Family Medicine

## 2023-03-25 ENCOUNTER — Telehealth: Payer: Self-pay | Admitting: Family Medicine

## 2023-03-25 ENCOUNTER — Ambulatory Visit: Payer: Medicare Other | Admitting: Family Medicine

## 2023-03-25 NOTE — Telephone Encounter (Signed)
Called patient to reschedule missed appointment, let patient know to call office back

## 2023-03-29 DIAGNOSIS — H40053 Ocular hypertension, bilateral: Secondary | ICD-10-CM | POA: Diagnosis not present

## 2023-04-05 DIAGNOSIS — H25813 Combined forms of age-related cataract, bilateral: Secondary | ICD-10-CM | POA: Diagnosis not present

## 2023-04-15 ENCOUNTER — Ambulatory Visit: Payer: Medicare Other | Admitting: Family Medicine

## 2023-04-15 ENCOUNTER — Encounter: Payer: Self-pay | Admitting: Family Medicine

## 2023-04-15 VITALS — BP 146/76 | HR 70 | Temp 97.8°F | Resp 16 | Ht 62.0 in | Wt 165.6 lb

## 2023-04-15 DIAGNOSIS — Z87891 Personal history of nicotine dependence: Secondary | ICD-10-CM

## 2023-04-15 DIAGNOSIS — E1159 Type 2 diabetes mellitus with other circulatory complications: Secondary | ICD-10-CM

## 2023-04-15 DIAGNOSIS — E785 Hyperlipidemia, unspecified: Secondary | ICD-10-CM

## 2023-04-15 DIAGNOSIS — I10 Essential (primary) hypertension: Secondary | ICD-10-CM | POA: Diagnosis not present

## 2023-04-15 LAB — POCT GLYCOSYLATED HEMOGLOBIN (HGB A1C): Hemoglobin A1C: 7.2 % — AB (ref 4.0–5.6)

## 2023-04-19 ENCOUNTER — Encounter: Payer: Self-pay | Admitting: Family Medicine

## 2023-04-19 NOTE — Progress Notes (Signed)
Established Patient Office Visit  Subjective    Patient ID: Melanie Weaver, female    DOB: April 02, 1941  Age: 82 y.o. MRN: [id number]  CC:  Chief Complaint  Patient presents with   Follow-up    3 month follow up    HPI Melanie Weaver presents for follow up of chronic med issues. Patient denies acute complaints or concerns.  Outpatient Encounter Medications as of 04/15/2023  Medication Sig   amLODipine (NORVASC) 10 MG tablet TAKE 1 TABLET(10 MG) BY MOUTH DAILY   aspirin EC 81 MG tablet Take 81 mg by mouth daily.   irbesartan (AVAPRO) 300 MG tablet Take 1 tablet (300 mg total) by mouth daily.   metoprolol succinate (TOPROL-XL) 50 MG 24 hr tablet TAKE 1 TABLET(50 MG) BY MOUTH DAILY   Multiple Vitamin (MULTIVITAMIN WITH MINERALS) TABS tablet Take 1 tablet by mouth daily.   pravastatin (PRAVACHOL) 80 MG tablet TAKE 1 TABLET(80 MG) BY MOUTH EVERY EVENING   spironolactone (ALDACTONE) 50 MG tablet TAKE 1 TABLET(50 MG) BY MOUTH DAILY   No facility-administered encounter medications on file as of 04/15/2023.    Past Medical History:  Diagnosis Date   Essential hypertension    Mixed hyperlipidemia    Parotid mass    Pulmonary nodule    Type 2 diabetes mellitus without complication, without long-term current use of insulin (HCC)     Past Surgical History:  Procedure Laterality Date   ABDOMINAL HYSTERECTOMY      Family History  Problem Relation Age of Onset   CVA Brother    Diabetes Son    Hypertension Son    Mental retardation Son    Stroke Son    Diabetes Daughter     Social History   Socioeconomic History   Marital status: Married    Spouse name: Not on file   Number of children: Not on file   Years of education: Not on file   Highest education level: Not on file  Occupational History   Not on file  Tobacco Use   Smoking status: Former    Current packs/day: 0.00    Average packs/day: 0.5 packs/day for 40.0 years (20.0 ttl pk-yrs)    Types: Cigarettes    Start  date: 12/04/1980    Quit date: 12/04/2020    Years since quitting: 2.3   Smokeless tobacco: Never   Tobacco comments:     1-2 cigs per day  Vaping Use   Vaping status: Never Used  Substance and Sexual Activity   Alcohol use: No   Drug use: No   Sexual activity: Not Currently  Other Topics Concern   Not on file  Social History Narrative   Lives with son with who is mentally handicapped.   Social Determinants of Health   Financial Resource Strain: Low Risk  (03/30/2021)   Overall Financial Resource Strain (CARDIA)    Difficulty of Paying Living Expenses: Not very hard  Food Insecurity: No Food Insecurity (03/30/2021)   Hunger Vital Sign    Worried About Running Out of Food in the Last Year: Never true    Ran Out of Food in the Last Year: Never true  Transportation Needs: No Transportation Needs (03/30/2021)   PRAPARE - Administrator, Civil Service (Medical): No    Lack of Transportation (Non-Medical): No  Physical Activity: Insufficiently Active (03/30/2021)   Exercise Vital Sign    Days of Exercise per Week: 3 days    Minutes of Exercise per Session: 10  min  Stress: No Stress Concern Present (03/30/2021)   [employer] of Occupational Health - Occupational Stress Questionnaire    Feeling of Stress : Not at all  Social Connections: Moderately Integrated (04/15/2023)   Social Connection and Isolation Panel [NHANES]    Frequency of Communication with Friends and Family: More than three times a week    Frequency of Social Gatherings with Friends and Family: More than three times a week    Attends Religious Services: More than 4 times per year    Active Member of [employer] or Organizations: Yes    Attends Banker Meetings: More than 4 times per year    Marital Status: Widowed  Intimate Partner Violence: Not At Risk (04/15/2023)   Humiliation, Afraid, Rape, and Kick questionnaire    Fear of Current or Ex-Partner: No    Emotionally Abused: No    Physically  Abused: No    Sexually Abused: No    Review of Systems  All other systems reviewed and are negative.       Objective    BP (!) 146/76 (BP Location: Right Arm, Patient Position: Sitting, Cuff Size: Normal)   Pulse 70   Temp 97.8 F (36.6 C) (Oral)   Resp 16   Ht 5\' 2"  (1.575 m)   Wt 165 lb 9.6 oz (75.1 kg)   SpO2 94%   BMI 30.29 kg/m   Physical Exam Vitals and nursing note reviewed.  Constitutional:      General: She is not in acute distress. Cardiovascular:     Rate and Rhythm: Normal rate and regular rhythm.  Pulmonary:     Effort: Pulmonary effort is normal.     Breath sounds: Normal breath sounds.  Abdominal:     Palpations: Abdomen is soft.     Tenderness: There is no abdominal tenderness.  Musculoskeletal:     Right lower leg: No edema.     Left lower leg: No edema.     Comments: Patient utilizing a cane for stability  Neurological:     General: No focal deficit present.     Mental Status: She is alert and oriented to person, place, and time.         Assessment & Plan:   Type 2 diabetes mellitus with other circulatory complication, without long-term current use of insulin (HCC) -     POCT glycosylated hemoglobin (Hb A1C)  Essential hypertension  Hyperlipidemia, unspecified hyperlipidemia type  Stopped smoking     Return in about 3 months (around 07/16/2023) for follow up.   Tommie Raymond, MD

## 2023-04-21 ENCOUNTER — Other Ambulatory Visit: Payer: Self-pay | Admitting: *Deleted

## 2023-04-21 ENCOUNTER — Telehealth: Payer: Self-pay | Admitting: Family Medicine

## 2023-04-21 DIAGNOSIS — I10 Essential (primary) hypertension: Secondary | ICD-10-CM

## 2023-04-21 MED ORDER — SPIRONOLACTONE 50 MG PO TABS
ORAL_TABLET | ORAL | 2 refills | Status: DC
Start: 2023-04-21 — End: 2023-10-19

## 2023-04-21 NOTE — Telephone Encounter (Signed)
Prescription Request  04/21/2023  LOV: 04/15/2023  What is the name of the medication or equipment? Spironolactone  Have you contacted your pharmacy to request a refill? No   Which pharmacy would you like this sent to?  [id number] [place], [place] - [phone] AT [facility] & [address] [place] [address] Phone: [phone] Fax: [phone]    Patient notified that their request is being sent to the clinical staff for review and that they should receive a response within 2 business days.   Please advise at Mobile [phone] (mobile)

## 2023-04-30 DIAGNOSIS — H25811 Combined forms of age-related cataract, right eye: Secondary | ICD-10-CM | POA: Diagnosis not present

## 2023-05-04 ENCOUNTER — Other Ambulatory Visit: Payer: Self-pay

## 2023-05-04 ENCOUNTER — Other Ambulatory Visit: Payer: Self-pay | Admitting: Family Medicine

## 2023-05-04 DIAGNOSIS — I10 Essential (primary) hypertension: Secondary | ICD-10-CM

## 2023-05-04 DIAGNOSIS — E785 Hyperlipidemia, unspecified: Secondary | ICD-10-CM

## 2023-05-04 MED ORDER — AMLODIPINE BESYLATE 10 MG PO TABS
ORAL_TABLET | ORAL | 1 refills | Status: DC
Start: 2023-05-04 — End: 2023-10-27

## 2023-05-04 MED ORDER — METOPROLOL SUCCINATE ER 50 MG PO TB24: 50.0000 mg | ORAL_TABLET | Freq: Every day | ORAL | 0 refills | Status: DC

## 2023-05-04 MED ORDER — PRAVASTATIN SODIUM 80 MG PO TABS: ORAL_TABLET | ORAL | 1 refills | Status: DC

## 2023-05-14 DIAGNOSIS — H25812 Combined forms of age-related cataract, left eye: Secondary | ICD-10-CM | POA: Diagnosis not present

## 2023-06-21 DIAGNOSIS — H524 Presbyopia: Secondary | ICD-10-CM | POA: Diagnosis not present

## 2023-07-22 ENCOUNTER — Encounter: Payer: Self-pay | Admitting: Family Medicine

## 2023-07-22 ENCOUNTER — Ambulatory Visit: Payer: Medicare Other | Admitting: Family Medicine

## 2023-07-22 VITALS — BP 143/61 | HR 59 | Temp 97.9°F | Resp 16 | Ht 62.0 in | Wt 166.4 lb

## 2023-07-22 DIAGNOSIS — E1159 Type 2 diabetes mellitus with other circulatory complications: Secondary | ICD-10-CM

## 2023-07-22 DIAGNOSIS — I10 Essential (primary) hypertension: Secondary | ICD-10-CM | POA: Diagnosis not present

## 2023-07-22 DIAGNOSIS — E785 Hyperlipidemia, unspecified: Secondary | ICD-10-CM | POA: Diagnosis not present

## 2023-07-22 LAB — POCT GLYCOSYLATED HEMOGLOBIN (HGB A1C): Hemoglobin A1C: 7.1 % — AB (ref 4.0–5.6)

## 2023-07-27 ENCOUNTER — Encounter: Payer: Self-pay | Admitting: Family Medicine

## 2023-07-27 NOTE — Progress Notes (Signed)
Established Patient Office Visit  Subjective    Patient ID: Melanie Weaver, female    DOB: 09-Aug-1940  Age: 83 y.o. MRN: [id number]  CC:  Chief Complaint  Patient presents with   Follow-up    3 month    HPI Melanie Weaver presents for routine follow up of diabetes and hypertension. Patient denies acute complaints or concerns.   Outpatient Encounter Medications as of 07/22/2023  Medication Sig   amLODipine (NORVASC) 10 MG tablet TAKE 1 TABLET(10 MG) BY MOUTH DAILY   amLODipine (NORVASC) 10 MG tablet TAKE 1 TABLET(10 MG) BY MOUTH DAILY   aspirin EC 81 MG tablet Take 81 mg by mouth daily.   irbesartan (AVAPRO) 300 MG tablet Take 1 tablet (300 mg total) by mouth daily.   metoprolol succinate (TOPROL-XL) 50 MG 24 hr tablet TAKE 1 TABLET(50 MG) BY MOUTH DAILY   metoprolol succinate (TOPROL-XL) 50 MG 24 hr tablet Take 1 tablet (50 mg total) by mouth daily. Take with or immediately following a meal.   Multiple Vitamin (MULTIVITAMIN WITH MINERALS) TABS tablet Take 1 tablet by mouth daily.   pravastatin (PRAVACHOL) 80 MG tablet TAKE 1 TABLET(80 MG) BY MOUTH EVERY EVENING   pravastatin (PRAVACHOL) 80 MG tablet TAKE 1 TABLET(80 MG) BY MOUTH EVERY EVENING   spironolactone (ALDACTONE) 50 MG tablet TAKE 1 TABLET(50 MG) BY MOUTH DAILY   No facility-administered encounter medications on file as of 07/22/2023.    Past Medical History:  Diagnosis Date   Essential hypertension    Mixed hyperlipidemia    Parotid mass    Pulmonary nodule    Type 2 diabetes mellitus without complication, without long-term current use of insulin (HCC)     Past Surgical History:  Procedure Laterality Date   ABDOMINAL HYSTERECTOMY      Family History  Problem Relation Age of Onset   CVA Brother    Diabetes Son    Hypertension Son    Mental retardation Son    Stroke Son    Diabetes Daughter     Social History   Socioeconomic History   Marital status: Married    Spouse name: Not on file   Number of  children: Not on file   Years of education: Not on file   Highest education level: Not on file  Occupational History   Not on file  Tobacco Use   Smoking status: Former    Current packs/day: 0.00    Average packs/day: 0.5 packs/day for 40.0 years (20.0 ttl pk-yrs)    Types: Cigarettes    Start date: 12/04/1980    Quit date: 12/04/2020    Years since quitting: 2.6   Smokeless tobacco: Never   Tobacco comments:     1-2 cigs per day  Vaping Use   Vaping status: Never Used  Substance and Sexual Activity   Alcohol use: No   Drug use: No   Sexual activity: Not Currently  Other Topics Concern   Not on file  Social History Narrative   Lives with son with who is mentally handicapped.   Social Drivers of Corporate investment banker Strain: Low Risk  (03/30/2021)   Overall Financial Resource Strain (CARDIA)    Difficulty of Paying Living Expenses: Not very hard  Food Insecurity: No Food Insecurity (03/30/2021)   Hunger Vital Sign    Worried About Running Out of Food in the Last Year: Never true    Ran Out of Food in the Last Year: Never true  Transportation Needs:  No Transportation Needs (03/30/2021)   PRAPARE - Administrator, Civil Service (Medical): No    Lack of Transportation (Non-Medical): No  Physical Activity: Insufficiently Active (03/30/2021)   Exercise Vital Sign    Days of Exercise per Week: 3 days    Minutes of Exercise per Session: 10 min  Stress: No Stress Concern Present (03/30/2021)   [employer] of Occupational Health - Occupational Stress Questionnaire    Feeling of Stress : Not at all  Social Connections: Moderately Integrated (04/15/2023)   Social Connection and Isolation Panel [NHANES]    Frequency of Communication with Friends and Family: More than three times a week    Frequency of Social Gatherings with Friends and Family: More than three times a week    Attends Religious Services: More than 4 times per year    Active Member of [employer] or  Organizations: Yes    Attends Banker Meetings: More than 4 times per year    Marital Status: Widowed  Intimate Partner Violence: Not At Risk (04/15/2023)   Humiliation, Afraid, Rape, and Kick questionnaire    Fear of Current or Ex-Partner: No    Emotionally Abused: No    Physically Abused: No    Sexually Abused: No    Review of Systems  All other systems reviewed and are negative.       Objective    BP (!) 143/61 (BP Location: Right Arm, Patient Position: Sitting, Cuff Size: Normal)   Pulse (!) 59   Temp 97.9 F (36.6 C) (Oral)   Resp 16   Ht 5\' 2"  (1.575 m)   Wt 166 lb 6.4 oz (75.5 kg)   SpO2 95%   BMI 30.43 kg/m   Physical Exam Vitals and nursing note reviewed.  Constitutional:      General: She is not in acute distress. Cardiovascular:     Rate and Rhythm: Normal rate and regular rhythm.  Pulmonary:     Effort: Pulmonary effort is normal.     Breath sounds: Normal breath sounds.  Abdominal:     Palpations: Abdomen is soft.     Tenderness: There is no abdominal tenderness.  Musculoskeletal:     Right lower leg: No edema.     Left lower leg: No edema.     Comments: Patient utilizing a cane for stability  Neurological:     General: No focal deficit present.     Mental Status: She is alert and oriented to person, place, and time.         Assessment & Plan:   1. Type 2 diabetes mellitus with other circulatory complication, without long-term current use of insulin (HCC) (Primary) Slight improvement in A1c and just at goal. Continue  - HgB A1c  2. Essential hypertension Continue   3. Hyperlipidemia, unspecified hyperlipidemia type Continue     Return in about 3 months (around 10/20/2023) for follow up.   Tommie Raymond, MD

## 2023-07-29 ENCOUNTER — Other Ambulatory Visit: Payer: Self-pay | Admitting: Family Medicine

## 2023-07-29 DIAGNOSIS — I10 Essential (primary) hypertension: Secondary | ICD-10-CM

## 2023-10-19 ENCOUNTER — Telehealth: Payer: Self-pay | Admitting: Family Medicine

## 2023-10-19 ENCOUNTER — Other Ambulatory Visit: Payer: Self-pay | Admitting: *Deleted

## 2023-10-19 DIAGNOSIS — I10 Essential (primary) hypertension: Secondary | ICD-10-CM

## 2023-10-19 MED ORDER — SPIRONOLACTONE 50 MG PO TABS: ORAL_TABLET | ORAL | 2 refills | Status: DC

## 2023-10-19 NOTE — Telephone Encounter (Signed)
 Pt is requesting refill for spironolactone medication. Please advise. Pt has a scheduled appt on 04/17

## 2023-10-19 NOTE — Telephone Encounter (Signed)
 completed

## 2023-10-21 ENCOUNTER — Ambulatory Visit: Payer: Self-pay | Admitting: Family Medicine

## 2023-10-21 ENCOUNTER — Encounter: Payer: Self-pay | Admitting: Family Medicine

## 2023-10-21 VITALS — BP 146/70 | HR 66 | Temp 97.9°F | Resp 16 | Ht 62.0 in | Wt 167.6 lb

## 2023-10-21 DIAGNOSIS — E785 Hyperlipidemia, unspecified: Secondary | ICD-10-CM | POA: Diagnosis not present

## 2023-10-21 DIAGNOSIS — I10 Essential (primary) hypertension: Secondary | ICD-10-CM

## 2023-10-21 DIAGNOSIS — E1159 Type 2 diabetes mellitus with other circulatory complications: Secondary | ICD-10-CM

## 2023-10-21 LAB — POCT GLYCOSYLATED HEMOGLOBIN (HGB A1C): Hemoglobin A1C: 6.9 % — AB (ref 4.0–5.6)

## 2023-10-27 ENCOUNTER — Encounter: Payer: Self-pay | Admitting: Family Medicine

## 2023-10-27 ENCOUNTER — Other Ambulatory Visit: Payer: Self-pay | Admitting: Family Medicine

## 2023-10-27 DIAGNOSIS — I10 Essential (primary) hypertension: Secondary | ICD-10-CM

## 2023-10-27 DIAGNOSIS — E785 Hyperlipidemia, unspecified: Secondary | ICD-10-CM

## 2023-10-27 NOTE — Progress Notes (Signed)
 Established Patient Office Visit  Subjective    Patient ID: Melanie Weaver, female    DOB: 06/01/1941  Age: 83 y.o. MRN: [id number]  CC:  Chief Complaint  Patient presents with   Follow-up    HPI Melanie Weaver presents for routine follow up of chronic med issues including diabetes and hypertension. Patient denies acute complaints.   Outpatient Encounter Medications as of 10/21/2023  Medication Sig   amLODipine  (NORVASC ) 10 MG tablet TAKE 1 TABLET(10 MG) BY MOUTH DAILY   aspirin  EC 81 MG tablet Take 81 mg by mouth daily.   irbesartan  (AVAPRO ) 300 MG tablet Take 1 tablet (300 mg total) by mouth daily.   metoprolol  succinate (TOPROL -XL) 50 MG 24 hr tablet TAKE 1 TABLET(50 MG) BY MOUTH DAILY   Multiple Vitamin (MULTIVITAMIN WITH MINERALS) TABS tablet Take 1 tablet by mouth daily.   pravastatin  (PRAVACHOL ) 80 MG tablet TAKE 1 TABLET(80 MG) BY MOUTH EVERY EVENING   spironolactone  (ALDACTONE ) 50 MG tablet TAKE 1 TABLET(50 MG) BY MOUTH DAILY   [DISCONTINUED] amLODipine  (NORVASC ) 10 MG tablet TAKE 1 TABLET(10 MG) BY MOUTH DAILY   [DISCONTINUED] metoprolol  succinate (TOPROL -XL) 50 MG 24 hr tablet TAKE 1 TABLET(50 MG) BY MOUTH DAILY WITH OR IMMEDIATELY FOLLOWING A MEAL   [DISCONTINUED] pravastatin  (PRAVACHOL ) 80 MG tablet TAKE 1 TABLET(80 MG) BY MOUTH EVERY EVENING   No facility-administered encounter medications on file as of 10/21/2023.    Past Medical History:  Diagnosis Date   Essential hypertension    Mixed hyperlipidemia    Parotid mass    Pulmonary nodule    Type 2 diabetes mellitus without complication, without long-term current use of insulin (HCC)     Past Surgical History:  Procedure Laterality Date   ABDOMINAL HYSTERECTOMY      Family History  Problem Relation Age of Onset   CVA Brother    Diabetes Son    Hypertension Son    Mental retardation Son    Stroke Son    Diabetes Daughter     Social History   Socioeconomic History   Marital status: Married     Spouse name: Not on file   Number of children: Not on file   Years of education: Not on file   Highest education level: Not on file  Occupational History   Not on file  Tobacco Use   Smoking status: Former    Current packs/day: 0.00    Average packs/day: 0.5 packs/day for 40.0 years (20.0 ttl pk-yrs)    Types: Cigarettes    Start date: 12/04/1980    Quit date: 12/04/2020    Years since quitting: 2.8   Smokeless tobacco: Never   Tobacco comments:     1-2 cigs per day  Vaping Use   Vaping status: Never Used  Substance and Sexual Activity   Alcohol use: No   Drug use: No   Sexual activity: Not Currently  Other Topics Concern   Not on file  Social History Narrative   Lives with son with who is mentally handicapped.   Social Drivers of Corporate investment banker Strain: Low Risk  (03/30/2021)   Overall Financial Resource Strain (CARDIA)    Difficulty of Paying Living Expenses: Not very hard  Food Insecurity: No Food Insecurity (03/30/2021)   Hunger Vital Sign    Worried About Running Out of Food in the Last Year: Never true    Ran Out of Food in the Last Year: Never true  Transportation Needs: No Transportation Needs (  03/30/2021)   PRAPARE - Administrator, Civil Service (Medical): No    Lack of Transportation (Non-Medical): No  Physical Activity: Insufficiently Active (03/30/2021)   Exercise Vital Sign    Days of Exercise per Week: 3 days    Minutes of Exercise per Session: 10 min  Stress: No Stress Concern Present (03/30/2021)   [employer] of Occupational Health - Occupational Stress Questionnaire    Feeling of Stress : Not at all  Social Connections: Moderately Integrated (04/15/2023)   Social Connection and Isolation Panel [NHANES]    Frequency of Communication with Friends and Family: More than three times a week    Frequency of Social Gatherings with Friends and Family: More than three times a week    Attends Religious Services: More than 4 times per  year    Active Member of [employer] or Organizations: Yes    Attends Banker Meetings: More than 4 times per year    Marital Status: Widowed  Intimate Partner Violence: Not At Risk (04/15/2023)   Humiliation, Afraid, Rape, and Kick questionnaire    Fear of Current or Ex-Partner: No    Emotionally Abused: No    Physically Abused: No    Sexually Abused: No    Review of Systems  All other systems reviewed and are negative.       Objective    BP (!) 146/70   Pulse 66   Temp 97.9 F (36.6 C) (Oral)   Resp 16   Ht 5\' 2"  (1.575 m)   Wt 167 lb 9.6 oz (76 kg)   SpO2 92%   BMI 30.65 kg/m   Physical Exam Vitals and nursing note reviewed.  Constitutional:      General: She is not in acute distress. Cardiovascular:     Rate and Rhythm: Normal rate and regular rhythm.  Pulmonary:     Effort: Pulmonary effort is normal.     Breath sounds: Normal breath sounds.  Abdominal:     Palpations: Abdomen is soft.     Tenderness: There is no abdominal tenderness.  Musculoskeletal:     Right lower leg: No edema.     Left lower leg: No edema.     Comments: Patient utilizing a cane for stability  Neurological:     General: No focal deficit present.     Mental Status: She is alert and oriented to person, place, and time.         Assessment & Plan:   Type 2 diabetes mellitus with other circulatory complication, without long-term current use of insulin (HCC) -     Renal function panel -     Microalbumin / creatinine urine ratio -     POCT glycosylated hemoglobin (Hb A1C)  Essential hypertension  Hyperlipidemia, unspecified hyperlipidemia type     Return in about 3 months (around 01/20/2024) for follow up.   Arlo Lama, MD

## 2024-01-27 ENCOUNTER — Other Ambulatory Visit: Payer: Self-pay | Admitting: Family Medicine

## 2024-01-27 DIAGNOSIS — I10 Essential (primary) hypertension: Secondary | ICD-10-CM

## 2024-02-07 ENCOUNTER — Encounter: Payer: Self-pay | Admitting: Family Medicine

## 2024-02-07 ENCOUNTER — Ambulatory Visit (INDEPENDENT_AMBULATORY_CARE_PROVIDER_SITE_OTHER): Admitting: Family Medicine

## 2024-02-07 VITALS — BP 160/75 | HR 60 | Ht 62.0 in | Wt 164.0 lb

## 2024-02-07 DIAGNOSIS — E785 Hyperlipidemia, unspecified: Secondary | ICD-10-CM | POA: Diagnosis not present

## 2024-02-07 DIAGNOSIS — J302 Other seasonal allergic rhinitis: Secondary | ICD-10-CM | POA: Diagnosis not present

## 2024-02-07 DIAGNOSIS — I10 Essential (primary) hypertension: Secondary | ICD-10-CM

## 2024-02-07 DIAGNOSIS — E1159 Type 2 diabetes mellitus with other circulatory complications: Secondary | ICD-10-CM | POA: Diagnosis not present

## 2024-02-07 MED ORDER — CETIRIZINE HCL 5 MG PO TABS: 5.0000 mg | ORAL_TABLET | Freq: Every day | ORAL | 5 refills | Status: AC

## 2024-02-07 NOTE — Progress Notes (Unsigned)
 Established Patient Office Visit  Subjective    Patient ID: Melanie Weaver, female    DOB: 1940-10-13  Age: 83 y.o. MRN: [id number]  CC:  Chief Complaint  Patient presents with   Medical Management of Chronic Issues    Pt reports no concerns but would like to talk to Dr about her allergies     HPI Melanie Weaver presents for routine follow up of chronic med issues including diabetes and hypertension. Patient also reports some increased allergy sx for the past several weeks.   Outpatient Encounter Medications as of 02/07/2024  Medication Sig   amLODipine  (NORVASC ) 10 MG tablet TAKE 1 TABLET(10 MG) BY MOUTH DAILY   amLODipine  (NORVASC ) 10 MG tablet TAKE 1 TABLET(10 MG) BY MOUTH DAILY   aspirin  EC 81 MG tablet Take 81 mg by mouth daily.   cetirizine  (ZYRTEC ) 5 MG tablet Take 1 tablet (5 mg total) by mouth daily.   irbesartan  (AVAPRO ) 300 MG tablet Take 1 tablet (300 mg total) by mouth daily.   metoprolol  succinate (TOPROL -XL) 50 MG 24 hr tablet TAKE 1 TABLET(50 MG) BY MOUTH DAILY   metoprolol  succinate (TOPROL -XL) 50 MG 24 hr tablet TAKE 1 TABLET(50 MG) BY MOUTH DAILY WITH OR IMMEDIATELY FOLLOWING A MEAL   Multiple Vitamin (MULTIVITAMIN WITH MINERALS) TABS tablet Take 1 tablet by mouth daily.   pravastatin  (PRAVACHOL ) 80 MG tablet TAKE 1 TABLET(80 MG) BY MOUTH EVERY EVENING   pravastatin  (PRAVACHOL ) 80 MG tablet TAKE 1 TABLET(80 MG) BY MOUTH EVERY EVENING   spironolactone  (ALDACTONE ) 50 MG tablet TAKE 1 TABLET(50 MG) BY MOUTH DAILY   No facility-administered encounter medications on file as of 02/07/2024.    Past Medical History:  Diagnosis Date   Essential hypertension    Mixed hyperlipidemia    Parotid mass    Pulmonary nodule    Type 2 diabetes mellitus without complication, without long-term current use of insulin (HCC)     Past Surgical History:  Procedure Laterality Date   ABDOMINAL HYSTERECTOMY      Family History  Problem Relation Age of Onset   CVA Brother     Diabetes Son    Hypertension Son    Mental retardation Son    Stroke Son    Diabetes Daughter     Social History   Socioeconomic History   Marital status: Married    Spouse name: Not on file   Number of children: Not on file   Years of education: Not on file   Highest education level: Not on file  Occupational History   Not on file  Tobacco Use   Smoking status: Former    Current packs/day: 0.00    Average packs/day: 0.5 packs/day for 40.0 years (20.0 ttl pk-yrs)    Types: Cigarettes    Start date: 12/04/1980    Quit date: 12/04/2020    Years since quitting: 3.1   Smokeless tobacco: Never   Tobacco comments:     1-2 cigs per day  Vaping Use   Vaping status: Never Used  Substance and Sexual Activity   Alcohol use: No   Drug use: No   Sexual activity: Not Currently  Other Topics Concern   Not on file  Social History Narrative   Lives with son with who is mentally handicapped.   Social Drivers of Corporate investment banker Strain: Low Risk  (03/30/2021)   Overall Financial Resource Strain (CARDIA)    Difficulty of Paying Living Expenses: Not very hard  Food Insecurity:  No Food Insecurity (03/30/2021)   Hunger Vital Sign    Worried About Running Out of Food in the Last Year: Never true    Ran Out of Food in the Last Year: Never true  Transportation Needs: No Transportation Needs (03/30/2021)   PRAPARE - Administrator, Civil Service (Medical): No    Lack of Transportation (Non-Medical): No  Physical Activity: Insufficiently Active (03/30/2021)   Exercise Vital Sign    Days of Exercise per Week: 3 days    Minutes of Exercise per Session: 10 min  Stress: No Stress Concern Present (03/30/2021)   [employer] of Occupational Health - Occupational Stress Questionnaire    Feeling of Stress : Not at all  Social Connections: Moderately Integrated (04/15/2023)   Social Connection and Isolation Panel    Frequency of Communication with Friends and Family: More  than three times a week    Frequency of Social Gatherings with Friends and Family: More than three times a week    Attends Religious Services: More than 4 times per year    Active Member of [employer] or Organizations: Yes    Attends Banker Meetings: More than 4 times per year    Marital Status: Widowed  Intimate Partner Violence: Not At Risk (04/15/2023)   Humiliation, Afraid, Rape, and Kick questionnaire    Fear of Current or Ex-Partner: No    Emotionally Abused: No    Physically Abused: No    Sexually Abused: No    Review of Systems  All other systems reviewed and are negative.       Objective    BP (!) 160/75   Pulse 60   Ht 5' 2 (1.575 m)   Wt 164 lb (74.4 kg)   SpO2 91%   BMI 30.00 kg/m   Physical Exam Vitals and nursing note reviewed.  Constitutional:      General: She is not in acute distress. Cardiovascular:     Rate and Rhythm: Normal rate and regular rhythm.  Pulmonary:     Effort: Pulmonary effort is normal.     Breath sounds: Normal breath sounds.  Abdominal:     Palpations: Abdomen is soft.     Tenderness: There is no abdominal tenderness.  Musculoskeletal:     Right lower leg: No edema.     Left lower leg: No edema.     Comments: Patient utilizing a cane for stability  Neurological:     General: No focal deficit present.     Mental Status: She is alert and oriented to person, place, and time.         Assessment & Plan:   Type 2 diabetes mellitus with other circulatory complication, without long-term current use of insulin (HCC)  Essential hypertension  Hyperlipidemia, unspecified hyperlipidemia type  Seasonal allergies  Other orders -     Cetirizine  HCl; Take 1 tablet (5 mg total) by mouth daily.  Dispense: 30 tablet; Refill: 5     Return in about 3 months (around 05/09/2024) for follow up, chronic med issues.   Tanda Raguel SQUIBB, MD

## 2024-02-08 ENCOUNTER — Encounter: Payer: Self-pay | Admitting: Family Medicine

## 2024-03-07 ENCOUNTER — Telehealth: Payer: Self-pay

## 2024-03-07 DIAGNOSIS — I10 Essential (primary) hypertension: Secondary | ICD-10-CM

## 2024-03-07 DIAGNOSIS — E785 Hyperlipidemia, unspecified: Secondary | ICD-10-CM

## 2024-03-07 NOTE — Telephone Encounter (Signed)
 Patient came in requesting refills on amLODipine  (NORVASC ) 10 MG , pravastatin  (PRAVACHOL ) 80 MG

## 2024-03-07 NOTE — Telephone Encounter (Signed)
 Forwarded to Dr. Andrey Campanile.

## 2024-03-07 NOTE — Addendum Note (Signed)
 Addended by: Jacqueli Pangallo E on: 03/07/2024 05:01 PM   Modules accepted: Orders

## 2024-03-08 MED ORDER — AMLODIPINE BESYLATE 10 MG PO TABS: ORAL_TABLET | ORAL | 1 refills | Status: AC

## 2024-03-08 MED ORDER — PRAVASTATIN SODIUM 80 MG PO TABS: ORAL_TABLET | ORAL | 1 refills | Status: AC

## 2024-03-09 NOTE — Telephone Encounter (Signed)
 Dr. Tanda spoke to pt out in lobby. Dr. Tanda sent it yesterday, but will re-send

## 2024-03-09 NOTE — Telephone Encounter (Signed)
 Patient came in stating she went to the pharmacy today but they advised her they didn't have any prescriptions ready for her. Patient is requesting a call back.

## 2024-04-26 ENCOUNTER — Other Ambulatory Visit: Payer: Self-pay | Admitting: Family Medicine

## 2024-04-26 DIAGNOSIS — I10 Essential (primary) hypertension: Secondary | ICD-10-CM

## 2024-04-28 NOTE — Progress Notes (Addendum)
 Charna Neeb                                          MRN: [id number]   04/28/2024   The [facility] Quality Team Specialist reviewed this patient medical record for the purposes of chart review for care gap closure. The following were reviewed: chart review for care gap closure-controlling blood pressure.KED MEASURE ORDER STILL PENDING    [facility] Quality Team

## 2024-05-09 ENCOUNTER — Ambulatory Visit (INDEPENDENT_AMBULATORY_CARE_PROVIDER_SITE_OTHER): Admitting: Family Medicine

## 2024-05-09 ENCOUNTER — Encounter: Payer: Self-pay | Admitting: Family Medicine

## 2024-05-09 VITALS — BP 158/78 | HR 66 | Ht 62.0 in | Wt 165.4 lb

## 2024-05-09 DIAGNOSIS — Z87891 Personal history of nicotine dependence: Secondary | ICD-10-CM

## 2024-05-09 DIAGNOSIS — E1159 Type 2 diabetes mellitus with other circulatory complications: Secondary | ICD-10-CM

## 2024-05-09 DIAGNOSIS — J302 Other seasonal allergic rhinitis: Secondary | ICD-10-CM | POA: Diagnosis not present

## 2024-05-09 DIAGNOSIS — E785 Hyperlipidemia, unspecified: Secondary | ICD-10-CM

## 2024-05-09 DIAGNOSIS — I10 Essential (primary) hypertension: Secondary | ICD-10-CM

## 2024-05-09 NOTE — Progress Notes (Unsigned)
 Established Patient Office Visit  Subjective    Patient ID: Melanie Weaver, female    DOB: 1940-10-02  Age: 83 y.o. MRN: [id number]  CC:  Chief Complaint  Patient presents with   Medical Management of Chronic Issues    Diabetic eye exam due     HPI Melanie Weaver presents for routine follow up of chronic med issues including diabetes and hypertension. Patient reports med compliance and denies acute complaints.  Outpatient Encounter Medications as of 05/09/2024  Medication Sig   amLODipine  (NORVASC ) 10 MG tablet TAKE 1 TABLET(10 MG) BY MOUTH DAILY   aspirin  EC 81 MG tablet Take 81 mg by mouth daily.   cetirizine  (ZYRTEC ) 5 MG tablet Take 1 tablet (5 mg total) by mouth daily.   irbesartan  (AVAPRO ) 300 MG tablet Take 1 tablet (300 mg total) by mouth daily.   metoprolol  succinate (TOPROL -XL) 50 MG 24 hr tablet TAKE 1 TABLET(50 MG) BY MOUTH DAILY   Multiple Vitamin (MULTIVITAMIN WITH MINERALS) TABS tablet Take 1 tablet by mouth daily.   pravastatin  (PRAVACHOL ) 80 MG tablet TAKE 1 TABLET(80 MG) BY MOUTH EVERY EVENING   spironolactone  (ALDACTONE ) 50 MG tablet TAKE 1 TABLET(50 MG) BY MOUTH DAILY   amLODipine  (NORVASC ) 10 MG tablet TAKE 1 TABLET(10 MG) BY MOUTH DAILY   metoprolol  succinate (TOPROL -XL) 50 MG 24 hr tablet TAKE 1 TABLET(50 MG) BY MOUTH DAILY WITH OR IMMEDIATELY FOLLOWING A MEAL   pravastatin  (PRAVACHOL ) 80 MG tablet TAKE 1 TABLET(80 MG) BY MOUTH EVERY EVENING   No facility-administered encounter medications on file as of 05/09/2024.    Past Medical History:  Diagnosis Date   Essential hypertension    Mixed hyperlipidemia    Parotid mass    Pulmonary nodule    Type 2 diabetes mellitus without complication, without long-term current use of insulin (HCC)     Past Surgical History:  Procedure Laterality Date   ABDOMINAL HYSTERECTOMY      Family History  Problem Relation Age of Onset   CVA Brother    Diabetes Son    Hypertension Son    Mental retardation Son     Stroke Son    Diabetes Daughter     Social History   Socioeconomic History   Marital status: Married    Spouse name: Not on file   Number of children: Not on file   Years of education: Not on file   Highest education level: Not on file  Occupational History   Not on file  Tobacco Use   Smoking status: Former    Current packs/day: 0.00    Average packs/day: 0.5 packs/day for 40.0 years (20.0 ttl pk-yrs)    Types: Cigarettes    Start date: 12/04/1980    Quit date: 12/04/2020    Years since quitting: 3.4   Smokeless tobacco: Never   Tobacco comments:     1-2 cigs per day  Vaping Use   Vaping status: Never Used  Substance and Sexual Activity   Alcohol use: No   Drug use: No   Sexual activity: Not Currently  Other Topics Concern   Not on file  Social History Narrative   Lives with son with who is mentally handicapped.   Social Drivers of Corporate Investment Banker Strain: Low Risk  (05/09/2024)   Overall Financial Resource Strain (CARDIA)    Difficulty of Paying Living Expenses: Not hard at all  Food Insecurity: No Food Insecurity (05/09/2024)   Hunger Vital Sign    Worried About  Running Out of Food in the Last Year: Never true    Ran Out of Food in the Last Year: Never true  Transportation Needs: No Transportation Needs (05/09/2024)   PRAPARE - Administrator, Civil Service (Medical): No    Lack of Transportation (Non-Medical): No  Physical Activity: Sufficiently Active (05/09/2024)   Exercise Vital Sign    Days of Exercise per Week: 3 days    Minutes of Exercise per Session: 60 min  Stress: No Stress Concern Present (05/09/2024)   [employer] of Occupational Health - Occupational Stress Questionnaire    Feeling of Stress: Not at all  Social Connections: Moderately Integrated (04/15/2023)   Social Connection and Isolation Panel    Frequency of Communication with Friends and Family: More than three times a week    Frequency of Social Gatherings with  Friends and Family: More than three times a week    Attends Religious Services: More than 4 times per year    Active Member of [employer] or Organizations: Yes    Attends Banker Meetings: More than 4 times per year    Marital Status: Widowed  Intimate Partner Violence: Not At Risk (04/15/2023)   Humiliation, Afraid, Rape, and Kick questionnaire    Fear of Current or Ex-Partner: No    Emotionally Abused: No    Physically Abused: No    Sexually Abused: No    Review of Systems  All other systems reviewed and are negative.       Objective    BP (!) 158/78   Pulse 66   Ht 5' 2 (1.575 m)   Wt 165 lb 6.4 oz (75 kg)   SpO2 92%   BMI 30.25 kg/m   Physical Exam Vitals and nursing note reviewed.  Constitutional:      General: She is not in acute distress. Cardiovascular:     Rate and Rhythm: Normal rate and regular rhythm.  Pulmonary:     Effort: Pulmonary effort is normal.     Breath sounds: Normal breath sounds.  Abdominal:     Palpations: Abdomen is soft.     Tenderness: There is no abdominal tenderness.  Musculoskeletal:     Right lower leg: No edema.     Left lower leg: No edema.     Comments: Patient utilizing a cane for stability  Neurological:     General: No focal deficit present.     Mental Status: She is alert and oriented to person, place, and time.         Assessment & Plan:   Essential hypertension  Type 2 diabetes mellitus with other circulatory complication, without long-term current use of insulin (HCC)  Hyperlipidemia, unspecified hyperlipidemia type  Seasonal allergies     Return in about 3 months (around 08/09/2024) for follow up, chronic med issues.   Tanda Raguel SQUIBB, MD

## 2024-05-23 ENCOUNTER — Other Ambulatory Visit: Payer: Self-pay | Admitting: Family Medicine

## 2024-05-23 DIAGNOSIS — E785 Hyperlipidemia, unspecified: Secondary | ICD-10-CM

## 2024-05-23 NOTE — Addendum Note (Signed)
 Addended by: Ayoub Arey E on: 05/23/2024 04:19 PM   Modules accepted: Orders

## 2024-05-23 NOTE — Telephone Encounter (Signed)
 Pt requesting refill for pravastatin

## 2024-05-24 MED ORDER — PRAVASTATIN SODIUM 80 MG PO TABS: ORAL_TABLET | ORAL | 1 refills | Status: AC

## 2024-06-05 ENCOUNTER — Other Ambulatory Visit: Payer: Self-pay

## 2024-06-05 ENCOUNTER — Telehealth: Payer: Self-pay | Admitting: Family Medicine

## 2024-06-05 DIAGNOSIS — I10 Essential (primary) hypertension: Secondary | ICD-10-CM

## 2024-06-05 MED ORDER — AMLODIPINE BESYLATE 10 MG PO TABS: ORAL_TABLET | ORAL | 1 refills | Status: AC

## 2024-06-05 NOTE — Telephone Encounter (Signed)
 Patient came in requesting refills on amLODipine  (NORVASC ) 10 MG tablet AND pravastatin  (PRAVACHOL ) 80 MG tablet

## 2024-06-05 NOTE — Telephone Encounter (Signed)
 Amlodipine  refilled. Pravastatin  refilled 11/22

## 2024-07-14 ENCOUNTER — Ambulatory Visit: Payer: Self-pay

## 2024-07-14 NOTE — Telephone Encounter (Signed)
 Noted thanks

## 2024-07-14 NOTE — Telephone Encounter (Signed)
" °  FYI Only or Action Required?: FYI only for provider: Home Care.  Patient was last seen in primary care on 05/09/2024 by Tanda Bleacher, MD.  Called Nurse Triage reporting Information Only.   Triage Disposition: Information or Advice Only Call  Patient/caregiver understands and will follow disposition?: Yes Message from Amy B sent at 07/14/2024  8:11 AM EST  Reason for Triage: Patient's daughter called from [place] .  She is concerned about patient having diarrhea, severe cough and congestion. She asks that someone reach out to the patient to discuss.   Reason for Disposition  General information question, no triage required and triager able to answer question  Answer Assessment - Initial Assessment Questions 1. REASON FOR CALL: What is the main reason for your call? or How can I best help you?     Pt's daughter called to report coughing, diarrhea, and congestion for pt.  Reached out to patient who states she feels significantly better and she told her daughter she was okay.  Pt declines triage at this time stating her symptoms have improved, however, would like a recommendation for a cough suppressant.  Pt advised to try warm tea and honey  Pt agrees with plan of care, will call back for any worsening symptoms  Protocols used: Information Only Call - No Triage-A-AH  "

## 2024-07-26 ENCOUNTER — Other Ambulatory Visit: Payer: Self-pay | Admitting: Family Medicine

## 2024-07-26 DIAGNOSIS — I10 Essential (primary) hypertension: Secondary | ICD-10-CM

## 2024-07-26 MED ORDER — SPIRONOLACTONE 50 MG PO TABS: ORAL_TABLET | ORAL | 2 refills | Status: AC

## 2024-07-27 ENCOUNTER — Other Ambulatory Visit: Payer: Self-pay | Admitting: Family Medicine

## 2024-07-27 DIAGNOSIS — I10 Essential (primary) hypertension: Secondary | ICD-10-CM

## 2024-07-27 NOTE — Telephone Encounter (Signed)
 Requested Prescriptions  Pending Prescriptions Disp Refills   metoprolol  succinate (TOPROL -XL) 50 MG 24 hr tablet [Pharmacy Med Name: METOPROLOL  ER SUCCINATE 50MG  TABS] 90 tablet 0    Sig: TAKE 1 TABLET(50 MG) BY MOUTH DAILY WITH OR IMMEDIATELY FOLLOWING A MEAL     Cardiovascular:  Beta Blockers Failed - 07/27/2024 12:50 PM      Failed - Last BP in normal range    BP Readings from Last 1 Encounters:  05/09/24 (!) 158/78         Passed - Last Heart Rate in normal range    Pulse Readings from Last 1 Encounters:  05/09/24 66         Passed - Valid encounter within last 6 months    Recent Outpatient Visits           2 months ago Essential hypertension   [facility], Raguel, MD   5 months ago Type 2 diabetes mellitus with other circulatory complication, without long-term current use of insulin (HCC)   [facility], Raguel, MD   9 months ago Type 2 diabetes mellitus with other circulatory complication, without long-term current use of insulin (HCC)   [facility], Raguel, MD   1 year ago Type 2 diabetes mellitus with other circulatory complication, without long-term current use of insulin [facility])   [facility] [facility], Raguel, MD   1 year ago Type 2 diabetes mellitus with other circulatory complication, without long-term current use of insulin [facility])   [facility], MD

## 2024-08-02 ENCOUNTER — Ambulatory Visit: Admitting: Family Medicine

## 2024-09-05 ENCOUNTER — Ambulatory Visit: Admitting: Family Medicine
